# Patient Record
Sex: Female | Born: 1985 | Hispanic: No | Marital: Single | Smoking: Never smoker
Health system: Southern US, Community
[De-identification: ages and names within clinical notes are randomized; demographics above are authoritative.]

## PROBLEM LIST (undated history)

## (undated) ENCOUNTER — Emergency Department (HOSPITAL_COMMUNITY): Payer: PPO | Source: Home / Self Care

## (undated) DIAGNOSIS — O021 Missed abortion: Secondary | ICD-10-CM

## (undated) DIAGNOSIS — G43909 Migraine, unspecified, not intractable, without status migrainosus: Secondary | ICD-10-CM

## (undated) DIAGNOSIS — Z789 Other specified health status: Secondary | ICD-10-CM

## (undated) HISTORY — PX: NO PAST SURGERIES: SHX2092

## (undated) HISTORY — PX: MOUTH SURGERY: SHX715

## (undated) HISTORY — DX: Other specified health status: Z78.9

---

## 2015-11-12 ENCOUNTER — Ambulatory Visit (INDEPENDENT_AMBULATORY_CARE_PROVIDER_SITE_OTHER): Payer: PPO | Admitting: Emergency Medicine

## 2015-11-12 VITALS — BP 108/56 | HR 84 | Temp 98.1°F | Resp 16 | Ht 61.0 in | Wt 140.0 lb

## 2015-11-12 DIAGNOSIS — R42 Dizziness and giddiness: Secondary | ICD-10-CM | POA: Diagnosis not present

## 2015-11-12 DIAGNOSIS — G44209 Tension-type headache, unspecified, not intractable: Secondary | ICD-10-CM | POA: Diagnosis not present

## 2015-11-12 LAB — CBC
HEMATOCRIT: 37.9 % (ref 36.0–46.0)
Hemoglobin: 13.2 g/dL (ref 12.0–15.0)
MCH: 30.8 pg (ref 26.0–34.0)
MCHC: 34.8 g/dL (ref 30.0–36.0)
MCV: 88.6 fL (ref 78.0–100.0)
MPV: 9.6 fL (ref 8.6–12.4)
PLATELETS: 232 10*3/uL (ref 150–400)
RBC: 4.28 MIL/uL (ref 3.87–5.11)
RDW: 13.6 % (ref 11.5–15.5)
WBC: 5.9 10*3/uL (ref 4.0–10.5)

## 2015-11-12 LAB — COMPREHENSIVE METABOLIC PANEL
ALK PHOS: 42 U/L (ref 33–115)
ALT: 9 U/L (ref 6–29)
AST: 13 U/L (ref 10–30)
Albumin: 4.2 g/dL (ref 3.6–5.1)
BILIRUBIN TOTAL: 0.4 mg/dL (ref 0.2–1.2)
BUN: 9 mg/dL (ref 7–25)
CO2: 27 mmol/L (ref 20–31)
CREATININE: 0.56 mg/dL (ref 0.50–1.10)
Calcium: 9.3 mg/dL (ref 8.6–10.2)
Chloride: 105 mmol/L (ref 98–110)
GLUCOSE: 94 mg/dL (ref 65–99)
Potassium: 4.4 mmol/L (ref 3.5–5.3)
SODIUM: 139 mmol/L (ref 135–146)
Total Protein: 7 g/dL (ref 6.1–8.1)

## 2015-11-12 LAB — TSH: TSH: 0.64 u[IU]/mL (ref 0.350–4.500)

## 2015-11-12 LAB — LIPID PANEL
Cholesterol: 141 mg/dL (ref 125–200)
HDL: 67 mg/dL (ref >=46)
LDL CALC: 65 mg/dL (ref <130)
Total CHOL/HDL Ratio: 2.1 Ratio (ref <=5.0)
Triglycerides: 46 mg/dL (ref <150)
VLDL: 9 mg/dL (ref <30)

## 2015-11-12 MED ORDER — BUTALBITAL-APAP-CAFFEINE 50-325-40 MG PO TABS: 1.0000 | ORAL_TABLET | Freq: Four times a day (QID) | ORAL | Status: DC | PRN

## 2015-11-12 NOTE — Progress Notes (Signed)
Subjective:  Patient ID: Selena Diaz, female    DOB: 22-May-1986  Age: 29 y.o. MRN: [id number]  CC: Dizziness and low blood pressure   HPI Selena Diaz presents   The patient allows that she has frequent dizziness and headache when she ventures out of her house as in going to the store. 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 She denies any antecedent illness or injury. She has no cough nasal congestion postnasal drainage fever chills sore throat nausea vomiting or stool change. She has persistent atelectatic last days at a time and her dizziness is occasional. She's not pregnant she's not sexually active with her mother and a number of other extended family members  History Selena Diaz has no past medical history on file.   She has no past surgical history on file.   Her  family history includes Heart disease in her mother.  She   reports that she has never smoked. She has never used smokeless tobacco. She reports that she does not drink alcohol or use illicit drugs.  No outpatient prescriptions prior to visit.   No facility-administered medications prior to visit.    Social History   Social History  . Marital Status: Single    Spouse Name: N/A  . Number of Children: N/A  . Years of Education: N/A   Social History Main Topics  . Smoking status: Never Smoker   . Smokeless tobacco: Never Used  . Alcohol Use: No  . Drug Use: No  . Sexual Activity: Not Asked   Other Topics Concern  . None   Social History Narrative  . None     Review of Systems  Constitutional: Negative for fever, chills and appetite change.  HENT: Negative for congestion, ear pain, postnasal drip, sinus pressure and sore throat.   Eyes: Negative for pain and redness.  Respiratory: Negative for cough, shortness of breath and wheezing.   Cardiovascular: Negative for leg swelling.  Gastrointestinal: Negative for nausea, vomiting, abdominal pain,  diarrhea, constipation and blood in stool.  Endocrine: Negative for polyuria.  Genitourinary: Negative for dysuria, urgency, frequency and flank pain.  Musculoskeletal: Negative for gait problem.  Skin: Negative for rash.  Neurological: Positive for dizziness and headaches. Negative for weakness.  Psychiatric/Behavioral: Negative for confusion and decreased concentration. The patient is not nervous/anxious.     Objective:  BP 108/56 mmHg  Pulse 84  Temp(Src) 98.1 F (36.7 C) (Oral)  Resp 16  Ht 5\' 1"  (1.549 m)  Wt 140 lb (63.504 kg)  BMI 26.47 kg/m2  SpO2 98%  LMP 10/28/2015 (Approximate)  Physical Exam  Constitutional: She is oriented to person, place, and time. She appears well-developed and well-nourished. No distress.  HENT:  Head: Normocephalic and atraumatic.  Right Ear: External ear normal.  Left Ear: External ear normal.  Nose: Nose normal.  Eyes: Conjunctivae and EOM are normal. Pupils are equal, round, and reactive to light. No scleral icterus.  Neck: Normal range of motion. Neck supple. No tracheal deviation present.  Cardiovascular: Normal rate, regular rhythm and normal heart sounds.   Pulmonary/Chest: Effort normal. No respiratory distress. She has no wheezes. She has no rales.  Abdominal: She exhibits no mass. There is no tenderness. There is no rebound and no guarding.  Musculoskeletal: She exhibits no edema.  Lymphadenopathy:    She has no cervical adenopathy.  Neurological: She is alert and oriented to person, place, and time. Coordination normal.  Skin: Skin is warm  and dry. No rash noted.  Psychiatric: She has a normal mood and affect. Her behavior is normal.      Assessment & Plan:   Braxton was seen today for dizziness and low blood pressure.  Diagnoses and all orders for this visit:  Dizzy -     CBC -     Comprehensive metabolic panel -     Lipid panel -     TSH -     VITAMIN D 25 Hydroxy (Vit-D Deficiency, Fractures)  Tension headache -      CBC -     Comprehensive metabolic panel -     Lipid panel -     TSH  Other orders -     butalbital-acetaminophen-caffeine (FIORICET) 50-325-40 MG tablet; Take 1-2 tablets by mouth every 6 (six) hours as needed for headache.   I am having Selena Diaz start on butalbital-acetaminophen-caffeine.  Meds ordered this encounter  Medications  . butalbital-acetaminophen-caffeine (FIORICET) 50-325-40 MG tablet    Sig: Take 1-2 tablets by mouth every 6 (six) hours as needed for headache.    Dispense:  50 tablet    Refill:  1    Appropriate red flag conditions were discussed with the patient as well as actions that should be taken.  Patient expressed his understanding.  Follow-up: Return if symptoms worsen or fail to improve.  Carmelina Dane, MD

## 2015-11-12 NOTE — Patient Instructions (Signed)

## 2015-11-13 ENCOUNTER — Other Ambulatory Visit: Payer: Self-pay | Admitting: Emergency Medicine

## 2015-11-13 LAB — VITAMIN D 25 HYDROXY (VIT D DEFICIENCY, FRACTURES): VIT D 25 HYDROXY: 15 ng/mL — AB (ref 30–100)

## 2015-11-13 MED ORDER — VITAMIN D (ERGOCALCIFEROL) 1.25 MG (50000 UNIT) PO CAPS: 50000.0000 [IU] | ORAL_CAPSULE | ORAL | Status: DC

## 2015-12-08 ENCOUNTER — Ambulatory Visit (INDEPENDENT_AMBULATORY_CARE_PROVIDER_SITE_OTHER): Payer: PPO | Admitting: Family Medicine

## 2015-12-08 VITALS — BP 101/65 | HR 74 | Temp 98.1°F | Resp 16 | Ht 61.5 in | Wt 139.0 lb

## 2015-12-08 DIAGNOSIS — E559 Vitamin D deficiency, unspecified: Secondary | ICD-10-CM | POA: Diagnosis not present

## 2015-12-08 DIAGNOSIS — Z23 Encounter for immunization: Secondary | ICD-10-CM

## 2015-12-08 DIAGNOSIS — L603 Nail dystrophy: Secondary | ICD-10-CM

## 2015-12-08 MED ORDER — VITAMIN D (ERGOCALCIFEROL) 1.25 MG (50000 UNIT) PO CAPS: ORAL_CAPSULE | ORAL | Status: DC

## 2015-12-08 NOTE — Progress Notes (Signed)
Patient ID: Selena Diaz, female    DOB: 11/10/1986  Age: 30 y.o. MRN: [id number]  Chief Complaint  Patient presents with  . Hand Pain    both  . Follow-up    vitamin D   . Flu Vaccine    Subjective:   No major acute complaints. She wanted to know whether she could get more vitamin D at a time. She apparently is only been getting for a month even though it looks like a larger quantity was prescribed. She is taking one weekly 50,000 units. She feels well. Her concerns also are that her nails are turning brittle and she has been turning gray hair. 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 [place] is from [place] and her mother is here visiting.   Current allergies, medications, problem list, past/family and social histories reviewed.  Objective:  BP 101/65 mmHg  Pulse 74  Temp(Src) 98.1 F (36.7 C) (Oral)  Resp 16  Ht 5' 1.5" (1.562 m)  Wt 139 lb (63.05 kg)  BMI 25.84 kg/m2  SpO2 98%  LMP 11/25/2015  No major acute distress. Nails look fairly good, slightly yellowish.  Assessment & Plan:   Assessment: 1. Vitamin D deficiency   2. Brittle nails   3. Needs flu shot       Plan:   Orders Placed This Encounter  Procedures  . Flu Vaccine QUAD 36+ mos IM  . Ambulatory referral to Dermatology    Referral Priority:  Routine    Referral Type:  Consultation    Referral Reason:  Specialty Services Required    Requested Specialty:  Dermatology    Number of Visits Requested:  1    Meds ordered this encounter  Medications  . Vitamin D, Ergocalciferol, (DRISDOL) 50000 units CAPS capsule    Sig: Take one weekly for vitamin D    Dispense:  13 capsule    Refill:  1         Patient Instructions  Continue taking the vitamin D  You will get your flu shot today  Because of your brittle nails we are referring to a dermatologist  Take a multivitamin 1 daily also for the nails.     No Follow-up on file.   HOPPER,DAVID, MD  12/08/2015

## 2015-12-08 NOTE — Patient Instructions (Addendum)
Continue taking the vitamin D  You will get your flu shot today  Because of your brittle nails we are referring to a dermatologist  Take a multivitamin 1 daily also for the nails.

## 2016-09-15 ENCOUNTER — Ambulatory Visit (INDEPENDENT_AMBULATORY_CARE_PROVIDER_SITE_OTHER): Payer: PPO | Admitting: Physician Assistant

## 2016-09-15 VITALS — BP 100/62 | HR 75 | Temp 98.3°F | Resp 17 | Ht 61.5 in | Wt 137.0 lb

## 2016-09-15 DIAGNOSIS — E559 Vitamin D deficiency, unspecified: Secondary | ICD-10-CM

## 2016-09-15 DIAGNOSIS — G8929 Other chronic pain: Secondary | ICD-10-CM | POA: Diagnosis not present

## 2016-09-15 DIAGNOSIS — M898X1 Other specified disorders of bone, shoulder: Secondary | ICD-10-CM

## 2016-09-15 DIAGNOSIS — Z23 Encounter for immunization: Secondary | ICD-10-CM

## 2016-09-15 LAB — CBC
HCT: 38.2 % (ref 35.0–45.0)
Hemoglobin: 13.2 g/dL (ref 11.7–15.5)
MCH: 29.9 pg (ref 27.0–33.0)
MCHC: 34.6 g/dL (ref 32.0–36.0)
MCV: 86.4 fL (ref 80.0–100.0)
MPV: 9.6 fL (ref 7.5–12.5)
PLATELETS: 251 10*3/uL (ref 140–400)
RBC: 4.42 MIL/uL (ref 3.80–5.10)
RDW: 13.7 % (ref 11.0–15.0)
WBC: 5.5 10*3/uL (ref 3.8–10.8)

## 2016-09-15 MED ORDER — MELOXICAM 15 MG PO TABS: 15.0000 mg | ORAL_TABLET | Freq: Every day | ORAL | 1 refills | Status: DC

## 2016-09-15 NOTE — Patient Instructions (Addendum)
Please ice the area three times per day for 15 minutes.  Please perform three of the stretches.  Ice directly after at that time.  I am referring you to PT.  This musculature appears very weakened.  Please do not take ibuprofen or naproxen.    Scapular Winging With Rehab Scapular winging syndrome is also known as serratus anterior palsy or long thoracic nerve injury. The condition is an uncommon injury to the nervous system. The condition is caused by injury to the long thoracic nerve that runs through the neck and shoulder. Injury to the shoulder, such as a fall or repetitive stress on the shoulder causes the nerve to become stretched. Occasionally the injury is the result of an infection of the nerve. Damage to the long thoracic nerve results in weakness of the serratus anterior muscle. The serratus anterior muscle is responsible for controlling the shoulder blade (scapula). Weakness in this muscle results in a instability (winging) of the scapula. SYMPTOMS   Pain and weakness in the shoulder (usually the back of the shoulder) that is often diffuse or unable to localize.  Loss of or decrease in shoulder function.  Upper back pain while sitting, due to the scapula pressing on the back of the chair.  Visible deformity in the back of the shoulder. CAUSES  Scapular winging is caused by stretching of the long thoracic nerve. Common mechanisms of injury include:  Viral illness.  Repetitive and/or stressful use of the shoulder.  Falling onto the shoulder with the head and neck stretched away from the shoulder. RISK INCREASES WITH:  Contact sports (football, rugby, lacrosse, or soccer).  Activities involving overhead arm movement (baseball, volleyball, or racquet sports).  Poor strength and flexibility. PREVENTION  Warm up and stretch properly before activity.  Allow for adequate recovery between workouts.  Maintain physical fitness:  Strength, flexibility, and  endurance.  Cardiovascular fitness.  Learn and use proper technique. When possible, have a coach correct improper technique. PROGNOSIS  Scapular winging normally resolves spontaneously within 18 months. In rare circumstances surgery is recommended.  RELATED COMPLICATIONS   Permanent nerve damage, including pain, numbness, tingle, or weakness.  Shoulder weakness.  Recurrent shoulder pain.  Inability to compete in athletics. TREATMENT Treatment initially involves resting from any activities that aggravate your symptoms. The use of ice and medication may help reduce pain and inflammation. The use of strengthening and stretching exercises may help reduce pain with activity, specifically shoulder exercises that improve range of motion. These exercises may be performed at home or with referral to a therapist. If symptoms persist for greater than 6 months despite non-surgical (conservative) treatment, then surgery may be recommended. Surgery is only used for the most serious cases and the purpose is to regain function, not to allow an athlete to return to sports. MEDICATION   If pain medication is necessary, then nonsteroidal anti-inflammatory medications, such as aspirin and ibuprofen, or other minor pain relievers, such as acetaminophen, are often recommended.  Do not take pain medication for 7 days before surgery.  Prescription pain relievers may be given if deemed necessary by your caregiver. Use only as directed and only as much as you need. HEAT AND COLD  Cold treatment (icing) relieves pain and reduces inflammation. Cold treatment should be applied for 10 to 15 minutes every 2 to 3 hours for inflammation and pain and immediately after any activity that aggravates your symptoms. Use ice packs or massage the area with a piece of ice (ice massage).  Heat treatment  may be used prior to performing the stretching and strengthening activities prescribed by your caregiver, physical therapist, or  athletic trainer. Use a heat pack or soak the injury in warm water. SEEK MEDICAL CARE IF:  Treatment seems to offer no benefit, or the condition worsens.  Any medications produce adverse side effects. EXERCISES  RANGE OF MOTION (ROM) AND STRETCHING EXERCISES - Scapular Winging (Serratus Anterior Palsy, Long Thoracic Nerve Injury)  These exercises may help you when beginning to rehabilitate your injury. Your symptoms may resolve with or without further involvement from your physician, physical therapist or athletic trainer. While completing these exercises, remember:   Restoring tissue flexibility helps normal motion to return to the joints. This allows healthier, less painful movement and activity.  An effective stretch should be held for at least 30 seconds.  A stretch should never be painful. You should only feel a gentle lengthening or release in the stretched tissue. ROM - Pendulum  Bend at the waist so that your right / left arm falls away from your body. Support yourself with your opposite hand on a solid surface, such as a table or a countertop.  Your right / left arm should be perpendicular to the ground. If it is not perpendicular, you need to lean over farther. Relax the muscles in your right / left arm and shoulder as much as possible.  Gently sway your hips and trunk so they move your right / left arm without any use of your right / left shoulder muscles.  Progress your movements so that your right / left arm moves side to side, then forward and backward, and finally, both clockwise and counterclockwise.  Complete __________ repetitions in each direction. Many people use this exercise to relieve discomfort in their shoulder as well as to gain range of motion. Repeat __________ times. Complete this exercise __________ times per day. STRETCH - Flexion, Seated   Sit in a firm chair so that your right / left forearm can rest on a table or on a table or countertop. Your right /  left elbow should rest below the height of your shoulder so that your shoulder feels supported and not tense or uncomfortable.  Keeping your right / left shoulder relaxed, lean forward at your waist, allowing your right / left hand to slide forward. Bend forward until you feel a moderate stretch in your shoulder, but before you feel an increase in your pain.  Hold __________ seconds. Slowly return to your starting position. Repeat __________ times. Complete this exercise __________ times per day.  STRETCH - Flexion, Standing  Stand with good posture. With an underhand grip on your right / left and an overhand grip on the opposite hand, grasp a broomstick or cane so that your hands are a little more than shoulder-width apart.  Keeping your right / left elbow straight and shoulder muscles relaxed, push the stick with your opposite hand to raise your right / left arm in front of your body and then overhead. Raise your arm until you feel a stretch in your right / left shoulder, but before you have increased shoulder pain.  Avoid shrugging your right / left shoulder as your arm rises by keeping your shoulder blade tucked down and toward your mid-back spine. Hold __________ seconds.  Slowly return to the starting position. Repeat __________ times. Complete this exercise __________ times per day. STRETCH - Abduction, Supine  Stand with good posture. With an underhand grip on your right / left and an overhand  grip on the opposite hand, grasp a broomstick or cane so that your hands are a little more than shoulder-width apart.  Keeping your right / left elbow straight and shoulder muscles relaxed, push the stick with your opposite hand to raise your right / left arm out to the side of your body and then overhead. Raise your arm until you feel a stretch in your right / left shoulder, but before you have increased shoulder pain.  Avoid shrugging your right / left shoulder as your arm rises by keeping your  shoulder blade tucked down and toward your mid-back spine. Hold __________ seconds.  Slowly return to the starting position. Repeat __________ times. Complete this exercise __________ times per day. ROM - Flexion, Active-Assisted  Lie on your back. You may bend your knees for comfort.  Grasp a broomstick or cane so your hands are about shoulder-width apart. Your right / left hand should grip the end of the stick/cane so that your hand is positioned "thumbs-up," as if you were about to shake hands.  Using your healthy arm to lead, raise your right / left arm overhead until you feel a gentle stretch in your shoulder. Hold __________ seconds.  Use the stick/cane to assist in returning your right / left arm to its starting position. Repeat __________ times. Complete this exercise __________ times per day.  STRENGTHENING EXERCISES - Scapular Winging (Serratus Anterior Palsy, Long Thoracic Nerve Injury) These exercises may help you when beginning to rehabilitate your injury. They may resolve your symptoms with or without further involvement from your physician, physical therapist or athletic trainer. While completing these exercises, remember:   Muscles can gain both the endurance and the strength needed for everyday activities through controlled exercises.  Complete these exercises as instructed by your physician, physical therapist or athletic trainer. Progress with the resistance and repetition exercises only as your caregiver advises.  You may experience muscle soreness or fatigue, but the pain or discomfort you are trying to eliminate should never worsen during these exercises. If this pain does worsen, stop and make certain you are following the directions exactly. If the pain is still present after adjustments, discontinue the exercise until you can discuss the trouble with your clinician.  During your recovery, avoid activity or exercises which involve actions that place your injured hand or  elbow above your head or behind your back or head. These positions stress the tissues which are trying to heal. STRENGTH - Scapular Depression and Adduction   With good posture, sit on a firm chair. Supported your arms in front of you with pillows, arm rests or a table top. Have your elbows in line with the sides of your body.  Gently draw your shoulder blades down and toward your mid-back spine. Gradually increase the tension without tensing the muscles along the top of your shoulders and the back of your neck.  Hold for __________ seconds. Slowly release the tension and relax your muscles completely before completing the next repetition.  After you have practiced this exercise, remove the arm support and complete it in standing as well as sitting. Repeat __________ times. Complete this exercise __________ times per day.  STRENGTH - Scapular Protractors, Standing   Stand arms-length away from a wall. Place your hands on the wall, keeping your elbows straight.  Begin by dropping your shoulder blades down and toward your mid-back spine.  To strengthen your protractors, keep your shoulder blades down, but slide them forward on your rib cage. It will  feel as if you are lifting the back of your rib cage away from the wall. This is a subtle motion and can be challenging to complete. Ask your clinician for further instruction if you are not sure you are doing the exercise correctly.  Hold for __________ seconds. Slowly return to the starting position, resting the muscles completely before completing the next repetition. Repeat __________ times. Complete this exercise __________ times per day. STRENGTH - Scapular Protractors, Supine  Lie on your back on a firm surface. Extend your right / left arm straight into the air while holding a __________ weight in your hand.  Keeping your head and back in place, lift your shoulder off the floor.  Hold __________ seconds. Slowly return to the starting  position and allow your muscles to relax completely before completing the next repetition. Repeat __________ times. Complete this exercise __________ times per day. STRENGTH - Scapular Protractors, Quadruped  Get onto your hands and knees with your shoulders directly over your hands (or as close as you comfortably can be).  Keeping your elbows locked, lift the back of your rib cage up into your shoulder blades so your mid-back rounds-out. Keep your neck muscles relaxed.  Hold this position for __________ seconds. Slowly return to the starting position and allow your muscles to relax completely before completing the next repetition. Repeat __________ times. Complete this exercise __________ times per day.  STRENGTH - Scapular Depressors  Keeping your feet on the floor, lift your bottom from the seat and lock your elbows.  Keeping your elbows straight, allow gravity to pull your body weight down. Your shoulders will rise toward your ears.  Raise your body against gravity by drawing your shoulder blades down your back, shortening the distance between your shoulders and ears. Although your feet should always maintain contact with the floor, your feet should progressively support less body weight as you get stronger.  Hold __________ seconds. In a controlled and slow manner, lower your body weight to begin the next repetition. Repeat __________ times. Complete this exercise __________ times per day.  STRENGTH - Shoulder Extensors, Prone  Lie on your stomach on a firm surface so that your right / left arm overhangs the edge. Rest your forehead on your opposite forearm. With your thumb facing away from your body and your elbow straight, hold a __________ weight in your hand.  Squeeze your right / left shoulder blade to your mid-back spine and then slowly raise your arm behind you to the height of the bed.  Hold for __________ seconds. Slowly reverse the directions and return to the starting  position, controlling the weight as you lower your arm. Repeat __________ times. Complete this exercise __________ times per day.  STRENGTH - Horizontal Abductors Choose one of the two oppositions to complete this exercise. Prone: lying on stomach:  Lie on your stomach on a firm surface so that your right / left arm overhangs the edge. Rest your forehead on your opposite forearm. With your palm facing the floor and your elbow straight, hold a __________ weight in your hand.  Squeeze your right / left shoulder blade to your mid-back spine and then slowly raise your arm to the height of the bed.  Hold for __________ seconds. Slowly reverse the directions and return to the starting position, controlling the weight as you lower your arm. Repeat __________ times. Complete this exercise __________ times per day. Standing:  Secure a rubber exercise band/tubing so that it is at the height  of your shoulders when you are either standing or sitting on a firm arm-less chair.  Grasp an end of the band/tubing in each hand and have your palms face each other. Straighten your elbows and lift your hands straight in front of you at shoulder height. Step back away from the secured end of band/tubing until it becomes tense.  Squeeze your shoulder blades together. Keeping your elbows locked and your hands at shoulder-height, bring your hands out to your side.  Hold __________ seconds. Slowly ease the tension on the band/tubing as you reverse the directions and return to the starting position. Repeat __________ times. Complete this exercise __________ times per day. STRENGTH - Scapular Retractors  Secure a rubber exercise band/tubing so that it is at the height of your shoulders when you are either standing or sitting on a firm arm-less chair.  With a palm-down grip, grasp an end of the band/tubing in each hand. Straighten your elbows and lift your hands straight in front of you at shoulder height. Step back  away from the secured end of band/tubing until it becomes tense.  Squeezing your shoulder blades together, draw your elbows back as you bend them. Keep your upper arm lifted away from your body throughout the exercise.  Hold __________ seconds. Slowly ease the tension on the band/tubing as you reverse the directions and return to the starting position. Repeat __________ times. Complete this exercise __________ times per day. STRENGTH - Shoulder Extensors   Secure a rubber exercise band/tubing so that it is at the height of your shoulders when you are either standing or sitting on a firm arm-less chair.  With a thumbs-up grip, grasp an end of the band/tubing in each hand. Straighten your elbows and lift your hands straight in front of you at shoulder height. Step back away from the secured end of band/tubing until it becomes tense.  Squeezing your shoulder blades together, pull your hands down to the sides of your thighs. Do not allow your hands to go behind you.  Hold for __________ seconds. Slowly ease the tension on the band/tubing as you reverse the directions and return to the starting position. Repeat __________ times. Complete this exercise __________ times per day.  STRENGTH - Scapular Retractors and External Rotators  Secure a rubber exercise band/tubing so that it is at the height of your shoulders when you are either standing or sitting on a firm arm-less chair.  With a palm-down grip, grasp an end of the band/tubing in each hand. Bend your elbows 90 degrees and lift your elbows to shoulder height at your sides. Step back away from the secured end of band/tubing until it becomes tense.  Squeezing your shoulder blades together, rotate your shoulder so that your upper arm and elbow remain stationary, but your fists travel upward to head-height.  Hold __________ for seconds. Slowly ease the tension on the band/tubing as you reverse the directions and return to the starting  position. Repeat __________ times. Complete this exercise __________ times per day.  STRENGTH - Scapular Retractors and External Rotators, Rowing  Secure a rubber exercise band/tubing so that it is at the height of your shoulders when you are either standing or sitting on a firm arm-less chair.  With a palm-down grip, grasp an end of the band/tubing in each hand. Straighten your elbows and lift your hands straight in front of you at shoulder height. Step back away from the secured end of band/tubing until it becomes tense.  Step 1: Squeeze  your shoulder blades together. Bending your elbows, draw your hands to your chest as if you are rowing a boat. At the end of this motion, your hands and elbow should be at shoulder-height and your elbows should be out to your sides.  Step 2: Rotate your shoulder to raise your hands above your head. Your forearms should be vertical and your upper-arms should be horizontal.  Hold for __________ seconds. Slowly ease the tension on the band/tubing as you reverse the directions and return to the starting position. Repeat __________ times. Complete this exercise __________ times per day.  STRENGTH - Scapular Retractors and Elevators  Secure a rubber exercise band/tubing so that it is at the height of your shoulders when you are either standing or sitting on a firm arm-less chair.  With a thumbs-up grip, grasp an end of the band/tubing in each hand. Step back away from the secured end of band/tubing until it becomes tense.  Squeezing your shoulder blades together, straighten your elbows and lift your hands straight over your head.  Hold for __________ seconds. Slowly ease the tension on the band/tubing as you reverse the directions and return to the starting position. Repeat __________ times. Complete this exercise __________ times per day.    This information is not intended to replace advice given to you by your health care provider. Make sure you discuss any  questions you have with your health care provider.   Document Released: 11/21/2005 Document Revised: 04/07/2015 Document Reviewed: 03/05/2009 Elsevier Interactive Patient Education 2016 [employer].    IF you received an x-ray today, you will receive an invoice from [facility]. Please contact [facility] at [phone] with questions or concerns regarding your invoice.   IF you received labwork today, you will receive an invoice from [employer]. Please contact [facility] at [phone] with questions or concerns regarding your invoice.   Our billing staff will not be able to assist you with questions regarding bills from these companies.  You will be contacted with the lab results as soon as they are available. The fastest way to get your results is to activate your My Chart account. Instructions are located on the last page of this paperwork. If you have not heard from [place] regarding the results in 2 weeks, please contact this office.

## 2016-09-15 NOTE — Progress Notes (Signed)
[facility] [address], [place] [address] [phone]  Date:  09/15/2016   Name:  Selena Diaz   DOB:  19-Apr-1986   MRN:  [id number]  PCP:  No PCP Per Patient    History of Present Illness:  Selena Diaz is a 30 y.o. female patient who presents to [facility] for cc of shoulder pain and recheck of vitamin D. Patient reports that she has had shoulder pain for about 4 years now. It hurts at her right shoulder blade. She says that it feels like it's open. It feels better with leaning back. She reports no injury. It also is aggravated by carrying objects and with driving long distances with her hands. Patient reports no numbness or tingling. There is no swelling.  She would also like to recheck her vitamin D. She is currently taking vitamin D medication at this time. She would also like to make sure that she has not iron deficient. She denies any heavy menses. Menstrual cycle occurs for about 5 days with one day that is heavy. She may go through 2-3 sanitary napkins. Patient denies any fatigue, palpitations, or chest pains.   There are no active problems to display for this patient.   No past medical history on file.  No past surgical history on file.  Social History  Substance Use Topics  . Smoking status: Never Smoker  . Smokeless tobacco: Never Used  . Alcohol use No    Family History  Problem Relation Age of Onset  . Heart disease Mother     No Known Allergies  Medication list has been reviewed and updated.  No current outpatient prescriptions on file prior to visit.   No current facility-administered medications on file prior to visit.     ROS ROS otherwise unremarkable unless listed above.  Physical Examination: BP 100/62 (BP Location: Right Arm, Patient Position: Sitting, Cuff Size: Normal)   Pulse 75   Temp 98.3 F (36.8 C) (Oral)   Resp 17   Ht 5' 1.5" (1.562 m)   Wt 137 lb (62.1 kg)   LMP 09/15/2016   SpO2 99%   BMI 25.47 kg/m   Ideal Body Weight: Weight in (lb) to have BMI = 25: 134.2  Physical Exam  Constitutional: She is oriented to person, place, and time. She appears well-developed and well-nourished. No distress.  HENT:  Head: Normocephalic and atraumatic.  Right Ear: External ear normal.  Left Ear: External ear normal.  Eyes: Conjunctivae and EOM are normal. Pupils are equal, round, and reactive to light.  Cardiovascular: Normal rate.  Exam reveals no friction rub.   No murmur heard. Pulmonary/Chest: Effort normal. No apnea. No respiratory distress. She has no decreased breath sounds. She has no wheezes. She has no rhonchi.  Musculoskeletal:  There is tenderness along the scapula inferiorly. It appears that there is some scapular winging with maneuvering. She appears with decreased strength along the right side with posterior thoracic strength.  Neurological: She is alert and oriented to person, place, and time.  Skin: She is not diaphoretic.  Psychiatric: She has a normal mood and affect. Her behavior is normal.     Assessment and Plan: [name] is a 30 y.o. female who is here today for cc of back pain, and vitamin D recheck. There is possible scapular winging. Possible nerve entrapment. Advising that she go to physical therapy as this was very weakened at this time. Advised anti-inflammatory and ice at this time. Physical therapy  referral ordered today and appreciated. Will recheck vitamin D. Also added a CBC. If her hemoglobin and MCV appear normal, will not pursue ferritin and iron levels at this time. Vitamin D deficiency - Plan: CBC, VITAMIN D 25 Hydroxy (Vit-D Deficiency, Fractures)  Need for prophylactic vaccination and inoculation against influenza - Plan: Flu Vaccine QUAD 36+ mos IM  Chronic scapular pain - Plan: meloxicam (MOBIC) 15 MG tablet, Ambulatory referral to Physical Therapy  Trena PlattStephanie Mykala Mccready, PA-C [facility] 09/15/2016 3:52  PM

## 2016-09-16 LAB — VITAMIN D 25 HYDROXY (VIT D DEFICIENCY, FRACTURES): VIT D 25 HYDROXY: 22 ng/mL — AB (ref 30–100)

## 2016-09-21 ENCOUNTER — Ambulatory Visit: Payer: PRIVATE HEALTH INSURANCE | Attending: Physician Assistant | Admitting: Physical Therapy

## 2016-09-21 DIAGNOSIS — M542 Cervicalgia: Secondary | ICD-10-CM | POA: Diagnosis present

## 2016-09-21 DIAGNOSIS — R293 Abnormal posture: Secondary | ICD-10-CM | POA: Diagnosis present

## 2016-09-21 NOTE — Therapy (Signed)
[facility] [address], [address] [place], [place], [address] Phone: [phone]   Fax:  [phone]  Physical Therapy Evaluation  Patient Details  Name: Selena Diaz MRN: [id number] Date of Birth: [birth year] Referring Provider: Trena PlattStephanie English, PA  Encounter Date: 09/21/2016      PT End of Session - 09/21/16 1330    Visit Number 1   Number of Visits 12   Date for PT Re-Evaluation 11/21/18   Authorization Type 12 visits, [employer]   PT Start Time 1235   PT Stop Time 1320   PT Time Calculation (min) 45 min   Activity Tolerance Patient tolerated treatment well   Behavior [facility] Therapy [facility] for tasks assessed/performed      No past medical history on file.  No past surgical history on file.  There were no vitals filed for this visit.       Subjective Assessment - 09/21/16 1243    Subjective Pt arriving to therapy complaining of 7/10 R shoulder pain which began about 4 years ago and has gradually  gotten worse. Pt is currently in graduate school for IT and reports sitting  a lot during the day.  Pt reports cupping makes it feel better for about 2 weeks but then it returns.    Limitations Sitting;House hold activities;Lifting   How long can you sit comfortably? 20 minutes   How long can you stand comfortably? unlimited   How long can you walk comfortably? unlimited without holding her purse.    Patient Stated Goals stop hurting in my arm and return to PLOF   Currently in Pain? Yes   Pain Score 7    Pain Location Shoulder   Pain Orientation Right   Pain Descriptors / Indicators Aching;Stabbing   Pain Type Chronic pain   Pain Onset More than a month ago   Pain Frequency Constant   Aggravating Factors  sitting, working on computer, sleeping   Pain Relieving Factors positioning, cupping   Effect of Pain on Daily Activities difficulty with working on computers with graduate work, sleeping, sitting,    Multiple Pain Sites  No            [facility] PT Assessment - 09/21/16 0001      Assessment   Medical Diagnosis R shoulder pain   Referring Provider Trena PlattStephanie English, PA   Hand Dominance Right   Prior Therapy none     Precautions   Precautions None     Balance Screen   Has the patient fallen in the past 6 months No     Home Environment   Living Environment Private residence   Living Arrangements Parent;Other relatives   Available Help at Discharge Family   Type of Home House   Home Access Level entry   Home Layout One level     Prior Function   Level of Independence Independent   Marketing executiveVocation Student   Vocation Requirements graduate student   Leisure walking     Cognition   Overall Cognitive Status Within Functional Limits for tasks assessed     Observation/Other Assessments   Focus on Therapeutic Outcomes (FOTO)  34% limitation     ROM / Strength   AROM / PROM / Strength AROM     AROM   AROM Assessment Site Shoulder;Cervical   Right/Left Shoulder Right;Left   Right Shoulder Extension 35 Degrees   Right Shoulder Flexion 154 Degrees   Right Shoulder ABduction 142 Degrees   Right Shoulder Internal Rotation 78  Degrees   Right Shoulder External Rotation 82 Degrees   Left Shoulder Extension 40 Degrees   Left Shoulder Flexion 165 Degrees   Left Shoulder ABduction 162 Degrees   Left Shoulder Internal Rotation 78 Degrees   Left Shoulder External Rotation 90 Degrees   Cervical Flexion 25   Cervical Extension 26   Cervical - Right Side Bend 28   Cervical - Left Side Bend 38   Cervical - Right Rotation 40   Cervical - Left Rotation 60     Special Tests    Special Tests Rotator Cuff Impingement   Rotator Cuff Impingment tests Empty Can test;Full Can test     Empty Can test   Findings Negative     Full Can test   Findings Negative                           PT Education - 09/21/16 1329    Education provided Yes   Education Details HEP   Person(s) Educated  Patient   Methods Explanation;Demonstration;Tactile cues;Verbal cues;Handout   Comprehension Returned demonstration;Verbalized understanding                  Patient will benefit from skilled therapeutic intervention in order to improve the following deficits and impairments:     Visit Diagnosis: Cervicalgia  Abnormal posture     Problem List There are no active problems to display for this patient.   Sharmon Leyden, MPT  09/21/2016, 1:35 PM  [facility] [address] [place], [place], [address] Phone: [phone]   Fax:  [phone]  Name: Selena Diaz MRN: [id number] Date of Birth: [birth year]

## 2016-09-21 NOTE — Patient Instructions (Addendum)
   Upper Trap Stretch: 5 times, holding 10 seconds, 2 times each day    Cervical rotaion: 5 times, holdinf 10 seconds, 2 times each day    Corner stretch: 5 times holding 20 seconds, 2 times each day

## 2016-09-28 ENCOUNTER — Ambulatory Visit: Payer: PRIVATE HEALTH INSURANCE | Admitting: Physical Therapy

## 2016-09-28 DIAGNOSIS — R293 Abnormal posture: Secondary | ICD-10-CM

## 2016-09-28 DIAGNOSIS — M542 Cervicalgia: Secondary | ICD-10-CM | POA: Diagnosis not present

## 2016-09-28 NOTE — Therapy (Addendum)
[facility] [address] [place], [place], [address] Phone: [phone]   Fax:  [phone]  Physical Therapy Treatment  Patient Details  Name: Selena Diaz MRN: [id number] Date of Birth: 09/25/1986 Referring Provider: Trena Platt, PA  Encounter Date: 09/28/2016      PT End of Session - 09/29/16 1301    Visit Number 3   Number of Visits 12   Date for PT Re-Evaluation 11/21/18   Authorization Type 12 visits, [employer]   PT Start Time 1235   PT Stop Time 1311   PT Time Calculation (min) 36 min   Activity Tolerance Patient tolerated treatment well   Behavior During Therapy [facility] for tasks assessed/performed      No past medical history on file.  No past surgical history on file.  There were no vitals filed for this visit.      Subjective Assessment - 09/29/16 1238    Subjective Pt reports shoulders feeling better today. Has been doing home exercises.    Limitations Sitting;House hold activities;Lifting   How long can you sit comfortably? 20 minutes   How long can you stand comfortably? unlimited   How long can you walk comfortably? unlimited without holding her purse.    Patient Stated Goals stop hurting in my arm and return to PLOF   Currently in Pain? Yes   Pain Score 4    Pain Location Shoulder   Pain Orientation Right   Pain Descriptors / Indicators Aching;Stabbing   Pain Type Chronic pain   Pain Onset More than a month ago                         [facility] Adult PT Treatment/Exercise - 09/29/16 0001      Neck Exercises: Machines for Strengthening   UBE (Upper Arm Bike) 2 minutes forward 2 minutes back  Therapist discussing treatment     Neck Exercises: Theraband   Shoulder Extension 20 reps;Red   Rows Red;20 reps     Shoulder Exercises: Supine   Horizontal ABduction Strengthening;Both;20 reps;Theraband   Theraband Level (Shoulder Horizontal ABduction) Level 2 (Red)   External  Rotation Strengthening;Both;20 reps;Theraband   Theraband Level (Shoulder External Rotation) Level 2 (Red)   Flexion Both;20 reps;Theraband  Narrow and widr grip   Theraband Level (Shoulder Flexion) Level 2 (Red)     Shoulder Exercises: Standing   Other Standing Exercises wall angels x 10 with verbal  indstructions  Therapist discussing treament     Shoulder Exercises: ROM/Strengthening   Other ROM/Strengthening Exercises Chest stretch at wall                PT Education - 09/28/16 1320    Education provided Yes   Education Details added 2 exercises to HEP   Person(s) Educated Patient   Methods Explanation;Demonstration;Tactile cues;Verbal cues;Handout   Comprehension Verbalized understanding;Returned demonstration          PT Short Term Goals - 09/28/16 1322      PT SHORT TERM GOAL #1   Title Pt will be independent in her HEP.   Time 3   Period Weeks   Status On-going     PT SHORT TERM GOAL #2   Title pt will be able to report decreased pain at rest <4/10 in order to improve quality of life and functional mobility.    Time 3   Period Weeks   Status On-going  PT Long Term Goals - 09/21/16 1352      PT LONG TERM GOAL #1   Title Pt will improve FOTO from 34% limitaion to 24% limitation.   Time 6   Period Weeks   Status New     PT LONG TERM GOAL #2   Title Pt will be bale to sit comfortably for 2 hours with only intermittent standing breaks in order to functional in her graduate studies.    Time 6   Period Weeks   Status New     PT LONG TERM GOAL #3   Title Pt will improve her cervical rotation to the right by 20 degrees in order to improve driving safety.    Baseline R rotation 40 degrees   Time 6   Period Weeks   Status New     PT LONG TERM GOAL #4   Title Pt will improve her R shoulder abduction by 20 degrees in order to improve functional mobility.    Baseline R abd 142 degrees, L abd 162 degrees   Time 6   Period Weeks   Status  New               Plan - 09/29/16 1339    Clinical Impression Statement Pt able to tolerate all strengthening exercises well needing verbal cues for technique and posture. Pt will continue to benfit from skilled therapy for postural training and strengthening.    Rehab Potential Excellent   PT Frequency 2x / week   PT Duration 6 weeks   PT Treatment/Interventions ADLs/Self Care Home Management;Patient/family education;Dry needling;Taping;Electrical Stimulation;Iontophoresis 4mg /ml Dexamethasone;Moist Heat;Manual techniques;Passive range of motion;Cryotherapy;Functional mobility training;Therapeutic activities;Therapeutic exercise   PT Next Visit Plan postural correction and edu for using her computer, core strengthening, cervical stretching, pectoralis stretching, lying on foam roller/towel,    Consulted and Agree with Plan of Care Patient      Patient will benefit from skilled therapeutic intervention in order to improve the following deficits and impairments:     Visit Diagnosis: Cervicalgia  Abnormal posture     Problem List There are no active problems to display for this patient.   Sharmon LeydenJennifer R Martin , MPT 09/29/2016, 2:13 PM  [facility] [address], [address] [place], [place], [address] Phone: [phone]   Fax:  [phone]  Name: Selena Diaz MRN: [id number] Date of Birth: 22-Nov-1986

## 2016-09-28 NOTE — Patient Instructions (Signed)
   Perform 10 times then rest anfd then repeat 10 more times, 1-2 times each day using red thera band.     Stand with your feet shoulder width apart, buttocks against the wall, shoulder blades against the wall and try to keep your elbows against the wall while lifting your arms above your head. Perform 10 times, 1-2 times each day.

## 2016-09-29 ENCOUNTER — Encounter: Payer: Self-pay | Admitting: Physical Therapy

## 2016-09-29 ENCOUNTER — Ambulatory Visit: Payer: PRIVATE HEALTH INSURANCE | Admitting: Physical Therapy

## 2016-09-29 DIAGNOSIS — R293 Abnormal posture: Secondary | ICD-10-CM

## 2016-09-29 DIAGNOSIS — M542 Cervicalgia: Secondary | ICD-10-CM

## 2016-09-29 NOTE — Therapy (Signed)
[facility] [address], [address] [place], [place], [address] Phone: [phone]   Fax:  [phone]  Physical Therapy Treatment  Patient Details  Name: Selena DeedsReem Diaz MRN: [id number] Date of Birth: 1986/05/27 Referring Provider: Trena PlattStephanie English, PA  Encounter Date: 09/29/2016      PT End of Session - 09/29/16 1301    Visit Number 3   Number of Visits 12   Date for PT Re-Evaluation 11/21/18   Authorization Type 12 visits, [employer]   PT Start Time 1235   PT Stop Time 1311   PT Time Calculation (min) 36 min   Activity Tolerance Patient tolerated treatment well   Behavior During Therapy [facility] for tasks assessed/performed      History reviewed. No pertinent past medical history.  History reviewed. No pertinent surgical history.  There were no vitals filed for this visit.      Subjective Assessment - 09/29/16 1238    Subjective Pt reports shoulders feeling better today. Has been doing home exercises.    Limitations Sitting;House hold activities;Lifting   How long can you sit comfortably? 20 minutes   How long can you stand comfortably? unlimited   How long can you walk comfortably? unlimited without holding her purse.    Patient Stated Goals stop hurting in my arm and return to PLOF   Currently in Pain? Yes   Pain Score 4    Pain Location Shoulder   Pain Orientation Right   Pain Descriptors / Indicators Aching;Stabbing   Pain Type Chronic pain   Pain Onset More than a month ago                         [facility] Treatment/Exercise - 09/29/16 0001      Neck Exercises: Machines for Strengthening   UBE (Upper Arm Bike) 2 minutes forward 2 minutes back  Therapist discussing treatment     Neck Exercises: Theraband   Shoulder Extension 20 reps;Red   Rows Red;20 reps     Shoulder Exercises: Supine   Horizontal ABduction Strengthening;Both;20 reps;Theraband   Theraband Level (Shoulder Horizontal  ABduction) Level 2 (Red)   External Rotation Strengthening;Both;20 reps;Theraband   Theraband Level (Shoulder External Rotation) Level 2 (Red)   Flexion Both;20 reps;Theraband  Narrow and widr grip   Theraband Level (Shoulder Flexion) Level 2 (Red)     Shoulder Exercises: Standing   Other Standing Exercises wall angels x 10 with verbal  indstructions  Therapist discussing treament     Shoulder Exercises: ROM/Strengthening   Other ROM/Strengthening Exercises Chest stretch at wall                PT Education - 09/28/16 1320    Education provided Yes   Education Details added 2 exercises to HEP   Person(s) Educated Patient   Methods Explanation;Demonstration;Tactile cues;Verbal cues;Handout   Comprehension Verbalized understanding;Returned demonstration          PT Short Term Goals - 09/28/16 1322      PT SHORT TERM GOAL #1   Title Pt will be independent in her HEP.   Time 3   Period Weeks   Status On-going     PT SHORT TERM GOAL #2   Title pt will be able to report decreased pain at rest <4/10 in order to improve quality of life and functional mobility.    Time 3   Period Weeks   Status On-going  PT Long Term Goals - 09/21/16 1352      PT LONG TERM GOAL #1   Title Pt will improve FOTO from 34% limitaion to 24% limitation.   Time 6   Period Weeks   Status New     PT LONG TERM GOAL #2   Title Pt will be bale to sit comfortably for 2 hours with only intermittent standing breaks in order to functional in her graduate studies.    Time 6   Period Weeks   Status New     PT LONG TERM GOAL #3   Title Pt will improve her cervical rotation to the right by 20 degrees in order to improve driving safety.    Baseline R rotation 40 degrees   Time 6   Period Weeks   Status New     PT LONG TERM GOAL #4   Title Pt will improve her R shoulder abduction by 20 degrees in order to improve functional mobility.    Baseline R abd 142 degrees, L abd 162  degrees   Time 6   Period Weeks   Status New               Plan - 09/29/16 1339    Clinical Impression Statement Pt able to tolerate all strengthening exercises well needing verbal cues for technique and posture. Pt will continue to benfit from skilled therapy for postural training and strengthening.    Rehab Potential Excellent   PT Frequency 2x / week   PT Duration 6 weeks   PT Treatment/Interventions ADLs/Self Care Home Management;Patient/family education;Dry needling;Taping;Electrical Stimulation;Iontophoresis 4mg /ml Dexamethasone;Moist Heat;Manual techniques;Passive range of motion;Cryotherapy;Functional mobility training;Therapeutic activities;Therapeutic exercise   PT Next Visit Plan postural correction and edu for using her computer, core strengthening, cervical stretching, pectoralis stretching, lying on foam roller/towel,    Consulted and Agree with Plan of Care Patient      Patient will benefit from skilled therapeutic intervention in order to improve the following deficits and impairments:  Pain, Impaired UE functional use, Decreased activity tolerance, Decreased range of motion, Postural dysfunction, Improper body mechanics  Visit Diagnosis: Cervicalgia  Abnormal posture     Problem List There are no active problems to display for this patient.   Dessa Phi PTA 09/29/2016, 1:41 PM  [facility] [address], [address] [place], [place], [address] Phone: [phone]   Fax:  [phone]  Name: Selena Diaz MRN: [id number] Date of Birth: [birth year]

## 2016-10-04 ENCOUNTER — Ambulatory Visit: Payer: PRIVATE HEALTH INSURANCE | Admitting: Physical Therapy

## 2016-10-04 DIAGNOSIS — R293 Abnormal posture: Secondary | ICD-10-CM

## 2016-10-04 DIAGNOSIS — M542 Cervicalgia: Secondary | ICD-10-CM

## 2016-10-04 NOTE — Therapy (Signed)
[facility] [address], [address] [place], [place], [address] Phone: [phone]   Fax:  [phone]  Physical Therapy Treatment  Patient Details  Name: Selena Diaz MRN: [id number] Date of Birth: 07/23/1986 Referring Provider: Trena PlattStephanie English, PA  Encounter Date: 10/04/2016      PT End of Session - 10/04/16 1313    Visit Number 4   Number of Visits 12   Date for PT Re-Evaluation 11/21/18   Authorization Type 12 visits, [employer]   PT Start Time 1234   PT Stop Time 1322   PT Time Calculation (min) 48 min      No past medical history on file.  No past surgical history on file.  There were no vitals filed for this visit.      Subjective Assessment - 10/04/16 1234    Subjective Reports she had some increase in pain after last visit.  Right rhomboid muscle region pain.  20% better.     Currently in Pain? Yes   Pain Score 5    Pain Location Scapula   Pain Orientation Right   Pain Type Chronic pain                         [facility] Adult PT Treatment/Exercise - 10/04/16 0001      Shoulder Exercises: Prone   Extension AROM;Right;15 reps   Horizontal ABduction 1 AROM;Strengthening;Right;15 reps     Manual Therapy   Manual Therapy Myofascial release   Manual therapy comments soft tissue mobilizations to medial scapula boarder   Myofascial Release upper trap right   Scapular Mobilization superior/inferior and medial/lateral          Trigger Point Dry Needling - 10/04/16 1307    Consent Given? Yes   Education Handout Provided Yes  discussed risk of pneumothorax   Muscles Treated Upper Body Upper trapezius;Rhomboids;Subscapularis              PT Education - 10/04/16 1312    Education provided Yes   Education Details dry needling after care;  rhomboid stretch; prone Is and Ts   Person(s) Educated Patient   Methods Explanation;Demonstration;Handout   Comprehension Verbalized  understanding;Returned demonstration          PT Short Term Goals - 10/04/16 1400      PT SHORT TERM GOAL #1   Title Pt will be independent in her HEP.   Time 3   Period Weeks   Status On-going     PT SHORT TERM GOAL #2   Title pt will be able to report decreased pain at rest <4/10 in order to improve quality of life and functional mobility.    Time 3   Period Weeks   Status On-going           PT Long Term Goals - 10/04/16 1418      PT LONG TERM GOAL #1   Title Pt will improve FOTO from 34% limitaion to 24% limitation.   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    Time 6   Period Weeks   Status On-going     PT LONG TERM GOAL #3   Title Pt will improve her cervical rotation to the right by 20 degrees in  order to improve driving safety.    Time 6   Period Weeks   Status On-going     PT LONG TERM GOAL #4   Title Pt will improve her R shoulder abduction by 20 degrees in order to improve functional mobility.    Time 6   Period Weeks   Status On-going               Plan - 10/04/16 1315    Clinical Impression Statement The patient has numerous tender points in right rhomboids, subscapularis and upper traps which contribute to her pain and affect scapular kinematics.  Following dry needling and manual therapy, she has improved soft tissue length and scapular mobility.  Therapist closely monitoring response with all interventions.     PT Next Visit Plan assess response to to Dn#1 and continue if helpful;  postual and scapular strengthening;  pec stretching;  foam roll ex      Patient will benefit from skilled therapeutic intervention in order to improve the following deficits and impairments:     Visit Diagnosis: Cervicalgia  Abnormal posture     Problem List There are no active  problems to display for this patient. Lavinia SharpsStacy Jil Penland, PT 10/04/16 2:20 PM Phone: [phone] Fax: [phone]  Vivien PrestoSimpson, Aritzel Krusemark C 10/04/2016, Santiago Bumpers2:20 PM  [facility] [address], [address] [place], [place], [address] Phone: [phone]   Fax:  [phone]  Name: Selena Diaz MRN: [id number] Date of Birth: 01-29-1986

## 2016-10-04 NOTE — Patient Instructions (Signed)
     Trigger Point Dry Needling  . What is Trigger Point Dry Needling (DN)? o DN is a physical therapy technique used to treat muscle pain and dysfunction. Specifically, DN helps deactivate muscle trigger points (muscle knots).  o A thin filiform needle is used to penetrate the skin and stimulate the underlying trigger point. The goal is for a local twitch response (LTR) to occur and for the trigger point to relax. No medication of any kind is injected during the procedure.   . What Does Trigger Point Dry Needling Feel Like?  o The procedure feels different for each individual patient. Some patients report that they do not actually feel the needle enter the skin and overall the process is not painful. Very mild bleeding may occur. However, many patients feel a deep cramping in the muscle in which the needle was inserted. This is the local twitch response.   . How Will I feel after the treatment? o Soreness is normal, and the onset of soreness may not occur for a few hours. Typically this soreness does not last longer than two days.  o Bruising is uncommon, however; ice can be used to decrease any possible bruising.  o In rare cases feeling tired or nauseous after the treatment is normal. In addition, your symptoms may get worse before they get better, this period will typically not last longer than 24 hours.   . What Can I do After My Treatment? o Increase your hydration by drinking more water for the next 24 hours. o You may place ice or heat on the areas treated that have become sore, however, do not use heat on inflamed or bruised areas. Heat often brings more relief post needling. o You can continue your regular activities, but vigorous activity is not recommended initially after the treatment for 24 hours. o DN is best combined with other physical therapy such as strengthening, stretching, and other therapies.    [facility] [address], [address] [place], [place] [address] Phone # [phone] Fax [phone] 

## 2016-10-05 ENCOUNTER — Other Ambulatory Visit: Payer: Self-pay | Admitting: Physician Assistant

## 2016-10-05 MED ORDER — ERGOCALCIFEROL 1.25 MG (50000 UT) PO CAPS: 50000.0000 [IU] | ORAL_CAPSULE | ORAL | 1 refills | Status: DC

## 2016-10-06 ENCOUNTER — Ambulatory Visit: Payer: PRIVATE HEALTH INSURANCE | Attending: Physician Assistant | Admitting: Physical Therapy

## 2016-10-06 ENCOUNTER — Encounter: Payer: Self-pay | Admitting: Physical Therapy

## 2016-10-06 DIAGNOSIS — R293 Abnormal posture: Secondary | ICD-10-CM | POA: Diagnosis present

## 2016-10-06 DIAGNOSIS — M542 Cervicalgia: Secondary | ICD-10-CM | POA: Diagnosis present

## 2016-10-06 NOTE — Therapy (Signed)
[facility] [address], [address] [place], [place], [address] Phone: [phone]   Fax:  [phone]  Physical Therapy Treatment  Patient Details  Name: Selena DeedsReem Diaz MRN: [id number] Date of Birth: [birth year] Referring Provider: Trena PlattStephanie English, PA  Encounter Date: 10/06/2016      PT End of Session - 10/06/16 1237    Visit Number 5   Number of Visits 12   Date for PT Re-Evaluation 11/21/18   Authorization Type 12 visits, [employer]   PT Start Time 1233   PT Stop Time 1315   PT Time Calculation (min) 42 min   Activity Tolerance Patient tolerated treatment well   Behavior During Therapy [facility] for tasks assessed/performed      History reviewed. No pertinent past medical history.  History reviewed. No pertinent surgical history.  There were no vitals filed for this visit.      Subjective Assessment - 10/06/16 1234    Subjective Pt reports feeling good after dry needling, had no adverse effects   Limitations Sitting;House hold activities;Lifting   How long can you sit comfortably? 20 minutes   How long can you stand comfortably? unlimited   How long can you walk comfortably? unlimited without holding her purse.    Patient Stated Goals stop hurting in my arm and return to PLOF   Currently in Pain? Yes   Pain Score 4    Pain Location Scapula   Pain Orientation Right   Pain Descriptors / Indicators Aching;Stabbing   Pain Type Chronic pain   Pain Onset More than a month ago   Pain Frequency Constant   Multiple Pain Sites No                         [facility] Adult PT Treatment/Exercise - 10/06/16 0001      Neck Exercises: Machines for Strengthening   UBE (Upper Arm Bike) L1 x 6 minutes (3/3)     Neck Exercises: Theraband   Shoulder Extension 20 reps;Red   Rows Red;20 reps     Shoulder Exercises: Supine   Horizontal ABduction Strengthening;Both;20 reps;Theraband   Theraband Level (Shoulder Horizontal  ABduction) Level 1 (Yellow)   External Rotation Strengthening;Both;20 reps;Theraband   Theraband Level (Shoulder External Rotation) Level 1 (Yellow)   Flexion Both;20 reps;Theraband  Narrow and widr grip   Theraband Level (Shoulder Flexion) Level 1 (Yellow)     Shoulder Exercises: Seated   Horizontal ABduction Strengthening;Both;20 reps   Theraband Level (Shoulder Horizontal ABduction) Level 1 (Yellow)     Shoulder Exercises: Prone   External Rotation Strengthening;Both;20 reps   Theraband Level (Shoulder External Rotation) Level 1 (Yellow)     Shoulder Exercises: Standing   Flexion AAROM;Both;10 reps   Extension AAROM;Both;10 reps                  PT Short Term Goals - 10/04/16 1400      PT SHORT TERM GOAL #1   Title Pt will be independent in her HEP.   Time 3   Period Weeks   Status On-going     PT SHORT TERM GOAL #2   Title pt will be able to report decreased pain at rest <4/10 in order to improve quality of life and functional mobility.    Time 3   Period Weeks   Status On-going           PT Long Term Goals - 10/04/16 1418  PT LONG TERM GOAL #1   Title Pt will improve FOTO from 34% limitaion to 24% limitation.   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    Time 6   Period Weeks   Status On-going     PT LONG TERM GOAL #3   Title Pt will improve her cervical rotation to the right by 20 degrees in order to improve driving safety.    Time 6   Period Weeks   Status On-going     PT LONG TERM GOAL #4   Title Pt will improve her R shoulder abduction by 20 degrees in order to improve functional mobility.    Time 6   Period Weeks   Status On-going               Plan - 10/06/16 1302    Clinical Impression Statement Pt responded well to dry needling. Pt reports having  increased Rt rhomboid pain when doing exercises or with doing dishes. Pt able to complete all exercises needing verbal cues for posture. Pt will continue to benefit from skilled therapy for shoulder and upper back strength and stability.    Rehab Potential Excellent   PT Frequency 2x / week   PT Duration 6 weeks   PT Treatment/Interventions ADLs/Self Care Home Management;Patient/family education;Dry needling;Taping;Electrical Stimulation;Iontophoresis 4mg /ml Dexamethasone;Moist Heat;Manual techniques;Passive range of motion;Cryotherapy;Functional mobility training;Therapeutic activities;Therapeutic exercise   PT Next Visit Plan Serratus strenghtening, shoulder strengthening, continue dry needling if treated by PT   Consulted and Agree with Plan of Care Patient      Patient will benefit from skilled therapeutic intervention in order to improve the following deficits and impairments:  Pain, Impaired UE functional use, Decreased activity tolerance, Decreased range of motion, Postural dysfunction, Improper body mechanics  Visit Diagnosis: Cervicalgia  Abnormal posture     Problem List There are no active problems to display for this patient.   Dessa PhiKatherine Matthews PTA 10/06/2016, 1:21 PM  [facility] [address], [address] [place], [place], [address] Phone: [phone]   Fax:  [phone]  Name: Selena DeedsReem Diaz MRN: [id number] Date of Birth: 30-Mar-1986

## 2016-10-11 ENCOUNTER — Ambulatory Visit: Payer: PRIVATE HEALTH INSURANCE | Admitting: Physical Therapy

## 2016-10-11 DIAGNOSIS — M542 Cervicalgia: Secondary | ICD-10-CM

## 2016-10-11 DIAGNOSIS — R293 Abnormal posture: Secondary | ICD-10-CM

## 2016-10-11 NOTE — Therapy (Signed)
[facility] [address], [address] [place], [place], [address] Phone: [phone]   Fax:  [phone]  Physical Therapy Treatment  Patient Details  Name: Selena Diaz MRN: [id number] Date of Birth: [birth year] Referring Provider: Trena PlattStephanie English, PA  Encounter Date: 10/11/2016      PT End of Session - 10/11/16 1313    Visit Number 6   Number of Visits 12   Date for PT Re-Evaluation 11/21/18   Authorization Type 12 visits, [employer]   PT Start Time 1235   PT Stop Time 1325   PT Time Calculation (min) 50 min   Activity Tolerance Patient tolerated treatment well      No past medical history on file.  No past surgical history on file.  There were no vitals filed for this visit.      Subjective Assessment - 10/11/16 1235    Subjective Patient reports the dry needling helped but the exercises seemed to aggravate so she stopped doing her home ex.  Feels OK during ex but hurts later.  Right  levator and rhomboid region pain.  Tightness in right upper trap.     Currently in Pain? Yes   Pain Score 4    Pain Location Neck   Pain Orientation Right   Pain Type Chronic pain                         [facility] Adult PT Treatment/Exercise - 10/11/16 0001      Moist Heat Therapy   Number Minutes Moist Heat 15 Minutes   Moist Heat Location Cervical     Electrical Stimulation   Electrical Stimulation Location right scapula   Electrical Stimulation Action Hi volt   Electrical Stimulation Parameters Hi volt 110 volts 15 min prone   Electrical Stimulation Goals Pain     Manual Therapy   Manual Therapy Joint mobilization   Manual therapy comments soft tissue mobilizations to medial scapula boarder   Joint Mobilization thoracic upper and mid mob with movement in sitting 10x each grade 3   Scapular Mobilization superior/inferior and medial/lateral          Trigger Point Dry Needling - 10/11/16 1312    Consent Given?  Yes   Muscles Treated Upper Body Upper trapezius;Levator scapulae;Rhomboids;Infraspinatus;Subscapularis   Upper Trapezius Response Twitch reponse elicited;Palpable increased muscle length   Levator Scapulae Response Twitch response elicited;Palpable increased muscle length   Rhomboids Response Twitch response elicited;Palpable increased muscle length   Infraspinatus Response Palpable increased muscle length   Subscapularis Response Palpable increased muscle length       Right only.         PT Short Term Goals - 10/11/16 1356      PT SHORT TERM GOAL #1   Title Pt will be independent in her HEP.   Time 3   Period Weeks   Status On-going     PT SHORT TERM GOAL #2   Title pt will be able to report decreased pain at rest <4/10 in order to improve quality of life and functional mobility.    Time 3   Period Weeks   Status On-going           PT Long Term Goals - 10/11/16 1357      PT LONG TERM GOAL #1   Title Pt will improve FOTO from 34% limitaion to 24% limitation.   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    Time 6   Period Weeks   Status On-going     PT LONG TERM GOAL #3   Title Pt will improve her cervical rotation to the right by 20 degrees in order to improve driving safety.    Time 6   Period Weeks   Status On-going     PT LONG TERM GOAL #4   Title Pt will improve her R shoulder abduction by 20 degrees in order to improve functional mobility.    Time 6   Period Weeks   Status On-going               Plan - 10/11/16 1353    Clinical Impression Statement The patient has continued tender points in right levator scap and rhomboid muscles which contribute to pain with exercise and to stand to do dishses.  Following dry needling and manual therapy, she has improved soft tissue lengths.   Decreased thoracic extension mobility.  Therapist closely monitoring response with all interventions.     PT Next Visit Plan assess response to Dn#2;  manual therapy; e-stim/heat as needed;  periscapular strengthening as tolerated;  postural taping?;  recheck shoulder AROM and cervical AROM      Patient will benefit from skilled therapeutic intervention in order to improve the following deficits and impairments:     Visit Diagnosis: Cervicalgia  Abnormal posture     Problem List There are no active problems to display for this patient.  Lavinia SharpsStacy Desaray Marschner, PT 10/11/16 1:59 PM Phone: [phone] Fax: [phone]  Vivien PrestoSimpson, Srihith Aquilino C 10/11/2016, 1:58 PM  [facility] [address], [address] [place], [place], [address] Phone: [phone]   Fax:  [phone]  Name: Selena Diaz MRN: [id number] Date of Birth: 10-Oct-1986

## 2016-10-13 ENCOUNTER — Encounter: Payer: Self-pay | Admitting: Physical Therapy

## 2016-10-13 ENCOUNTER — Ambulatory Visit: Payer: PRIVATE HEALTH INSURANCE | Admitting: Physical Therapy

## 2016-10-13 DIAGNOSIS — M542 Cervicalgia: Secondary | ICD-10-CM | POA: Diagnosis not present

## 2016-10-13 DIAGNOSIS — R293 Abnormal posture: Secondary | ICD-10-CM

## 2016-10-13 NOTE — Therapy (Signed)
[facility] [address] [place], [place], [address] Phone: [phone]   Fax:  [phone]  Physical Therapy Treatment  Patient Details  Name: Selena DeedsReem Diaz MRN: [id number] Date of Birth: 02-Aug-1986 Referring Provider: Trena PlattStephanie English, PA  Encounter Date: 10/13/2016      PT End of Session - 10/13/16 1242    Visit Number 7   Number of Visits 12   Date for PT Re-Evaluation 11/21/18   Authorization Type 12 visits, [employer]   PT Start Time 1238   PT Stop Time 1332   PT Time Calculation (min) 54 min   Activity Tolerance Patient tolerated treatment well   Behavior During Therapy [facility] for tasks assessed/performed      History reviewed. No pertinent past medical history.  History reviewed. No pertinent surgical history.  There were no vitals filed for this visit.      Subjective Assessment - 10/13/16 1240    Subjective Pt reports dry needling helped, usually has pain after leaving on Thursdays.    Limitations Sitting;House hold activities;Lifting   How long can you sit comfortably? 20 minutes   How long can you stand comfortably? unlimited   How long can you walk comfortably? unlimited without holding her purse.    Patient Stated Goals stop hurting in my arm and return to PLOF   Currently in Pain? Yes   Pain Score 4    Pain Location Neck   Pain Orientation Right   Pain Descriptors / Indicators Aching;Stabbing   Pain Type Chronic pain   Pain Onset More than a month ago   Pain Frequency Constant                         [facility] Adult PT Treatment/Exercise - 10/13/16 0001      Neck Exercises: Machines for Strengthening   UBE (Upper Arm Bike) L1 x 6 minutes (3/3)  Therapist present to discuss treatment     Shoulder Exercises: Prone   Extension Strengthening;Both;10 reps   Horizontal ABduction 1 Strengthening;Both;10 reps     Shoulder Exercises: Standing   Other Standing Exercises Shoulder  adduction  Yellow tband 2x10     Moist Heat Therapy   Number Minutes Moist Heat 15 Minutes   Moist Heat Location Cervical     Electrical Stimulation   Electrical Stimulation Location right scapula   Electrical Stimulation Action IFC   Electrical Stimulation Parameters To tolerance   Electrical Stimulation Goals Pain     Manual Therapy   Manual Therapy Soft tissue mobilization   Manual therapy comments Pt prone   Joint Mobilization to Rt rhomoids and low traps                  PT Short Term Goals - 10/11/16 1356      PT SHORT TERM GOAL #1   Title Pt will be independent in her HEP.   Time 3   Period Weeks   Status On-going     PT SHORT TERM GOAL #2   Title pt will be able to report decreased pain at rest <4/10 in order to improve quality of life and functional mobility.    Time 3   Period Weeks   Status On-going           PT Long Term Goals - 10/11/16 1357      PT LONG TERM GOAL #1   Title Pt will improve FOTO from 34% limitaion to 24%  limitation.   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    Time 6   Period Weeks   Status On-going     PT LONG TERM GOAL #3   Title Pt will improve her cervical rotation to the right by 20 degrees in order to improve driving safety.    Time 6   Period Weeks   Status On-going     PT LONG TERM GOAL #4   Title Pt will improve her R shoulder abduction by 20 degrees in order to improve functional mobility.    Time 6   Period Weeks   Status On-going               Plan - 10/13/16 1327    Clinical Impression Statement Pt reports increased pain after a lot of exercises. Able to tolerate exercises to strengthen back. Soft tissue massge to decrease tender points in back. Pt will contiue to benefit from skilled therapy for postural training and scapular  stability.    Rehab Potential Excellent   PT Frequency 2x / week   PT Duration 6 weeks   PT Treatment/Interventions ADLs/Self Care Home Management;Patient/family education;Dry needling;Taping;Electrical Stimulation;Iontophoresis 4mg /ml Dexamethasone;Moist Heat;Manual techniques;Passive range of motion;Cryotherapy;Functional mobility training;Therapeutic activities;Therapeutic exercise   PT Next Visit Plan Manual therapy, modalities as needed, postural training as tolerate.    PT Home Exercise Plan cervical rotaion, upper trap stretch, corner stretch, rows, wall angles   Consulted and Agree with Plan of Care Patient      Patient will benefit from skilled therapeutic intervention in order to improve the following deficits and impairments:  Pain, Impaired UE functional use, Decreased activity tolerance, Decreased range of motion, Postural dysfunction, Improper body mechanics  Visit Diagnosis: Cervicalgia  Abnormal posture     Problem List There are no active problems to display for this patient.   Dessa PhiKatherine Ruel Dimmick PTA 10/13/2016, 1:35 PM  [facility] [address] [place], [place], [address] Phone: [phone]   Fax:  [phone]  Name: Selena DeedsReem Diaz MRN: [id number] Date of Birth: 06-23-1986

## 2016-10-17 ENCOUNTER — Ambulatory Visit (INDEPENDENT_AMBULATORY_CARE_PROVIDER_SITE_OTHER): Payer: PPO | Admitting: Physician Assistant

## 2016-10-17 VITALS — BP 100/60 | HR 84 | Temp 97.7°F | Resp 16 | Ht 61.5 in | Wt 139.6 lb

## 2016-10-17 DIAGNOSIS — M79641 Pain in right hand: Secondary | ICD-10-CM

## 2016-10-17 DIAGNOSIS — M67432 Ganglion, left wrist: Secondary | ICD-10-CM

## 2016-10-17 DIAGNOSIS — E559 Vitamin D deficiency, unspecified: Secondary | ICD-10-CM | POA: Insufficient documentation

## 2016-10-17 DIAGNOSIS — M67431 Ganglion, right wrist: Secondary | ICD-10-CM | POA: Diagnosis not present

## 2016-10-17 DIAGNOSIS — M79642 Pain in left hand: Secondary | ICD-10-CM | POA: Diagnosis not present

## 2016-10-17 NOTE — Patient Instructions (Addendum)
IF you received an x-ray today, you will receive an invoice from [facility]. Please contact [facility] at [phone] with questions or concerns regarding your invoice.   IF you received labwork today, you will receive an invoice from [facility]. Please contact [facility] at [phone] with questions or concerns regarding your invoice.   Our billing staff will not be able to assist you with questions regarding bills from these companies.  You will be contacted with the lab results as soon as they are available. The fastest way to get your results is to activate your My Chart account. Instructions are located on the last page of this paperwork. If you have not heard from us regarding the results in 2 weeks, please contact this office.      Ganglion Cyst A ganglion cyst is a noncancerous, fluid-filled lump that occurs near joints or tendons. The ganglion cyst grows out of a joint or the lining of a tendon. It most often develops in the hand or wrist, but it can also develop in the shoulder, elbow, hip, knee, ankle, or foot. The round or oval ganglion cyst can be the size of a pea or larger than a grape. Increased activity may enlarge the size of the cyst because more fluid starts to build up.  CAUSES It is not known what causes a ganglion cyst to grow. However, it may be related to:  Inflammation or irritation around the joint.  An injury.  Repetitive movements or overuse.  Arthritis. RISK FACTORS Risk factors include:  Being a woman.  Being age 28-50. SIGNS AND SYMPTOMS Symptoms may include:   A lump. This most often appears on the hand or wrist, but it can occur in other areas of the body.  Tingling.  Pain.  Numbness.  Muscle weakness.  Weak grip.  Less movement in a joint. DIAGNOSIS Ganglion cysts are most often diagnosed based on a physical exam. Your health care provider will feel the lump and may shine a light  alongside it. If it is a ganglion cyst, a light often shines through it. Your health care provider may order an X-ray, ultrasound, or MRI to rule out other conditions. TREATMENT Ganglion cysts usually go away on their own without treatment. If pain or other symptoms are involved, treatment may be needed. Treatment is also needed if the ganglion cyst limits your movement or if it gets infected. Treatment may include:  Wearing a brace or splint on your wrist or finger.  Taking anti-inflammatory medicine.  Draining fluid from the lump with a needle (aspiration).  Injecting a steroid into the joint.  Surgery to remove the ganglion cyst. HOME CARE INSTRUCTIONS  Do not press on the ganglion cyst, poke it with a needle, or hit it.  Take medicines only as directed by your health care provider.  Wear your brace or splint as directed by your health care provider.  Watch your ganglion cyst for any changes.  Keep all follow-up visits as directed by your health care provider. This is important. SEEK MEDICAL CARE IF:  Your ganglion cyst becomes larger or more painful.  You have increased redness, red streaks, or swelling.  You have pus coming from the lump.  You have weakness or numbness in the affected area.  You have a fever or chills.   This information is not intended to replace advice given to you by your health care provider. Make sure you discuss any questions you have with your health  care provider.   Document Released: 11/18/2000 Document Revised: 12/12/2014 Document Reviewed: 05/06/2014 Elsevier Interactive Patient Education [employer]2016 Elsevier Inc.

## 2016-10-17 NOTE — Progress Notes (Signed)
Subjective:    Patient ID: Selena Diaz, female    DOB: 1986-04-07, 30 y.o.   MRN: [id number]   Chief Complaint  Patient presents with  . Hand Pain    pain/tingling in both hands x1 week; more in right hand; wears brace for comfort   HPI: Presents for bilateral wrist pain which she has had for years which has been flaring for the past week. Denies injury or trauma. States her right wrist is worse than her left. Notices that the pain flares when she is writing or typing for long time periods. Denies numbness, tingling, loss of strength or motor control, redness, swelling, or erythema. Has noticed bilateral lumps in her wrists which are present when she flexes them and which are point-tender to palpation. States the pain is increased when sleeping. She was provided a wrist splint in the past to help with the pain which provides some brief relief but recently has noticed the pain will come back. Denies use of medications at home to help. Denies use of stretches at home to help.  Review of Systems Pertinent ROS mentioned above in HPI, otherwise negative.  No Known Allergies  Prior to Admission medications   Medication Sig Start Date End Date Taking? Authorizing Provider  ergocalciferol (VITAMIN D2) 50000 units capsule Take 1 capsule (50,000 Units total) by mouth once a week. 10/05/16  Yes Garnetta BuddyStephanie D English, PA   Patient Active Problem List   Diagnosis Date Noted  . Vitamin D deficiency 10/17/2016       Objective: Blood pressure 100/60, pulse 84, temperature 97.7 F (36.5 C), temperature source Oral, resp. rate 16, height 5' 1.5" (1.562 m), weight 139 lb 9.6 oz (63.3 kg), last menstrual period 10/13/2016, SpO2 100 %.   Physical Exam  Constitutional: She is oriented to person, place, and time. She appears well-developed and well-nourished. No distress.  HENT:  Head: Normocephalic and atraumatic.  Cardiovascular:  Pulses:      Radial pulses are 2+ on the right side, and 2+ on the left  side.  Musculoskeletal: She exhibits tenderness.       Right wrist: She exhibits decreased range of motion and tenderness. She exhibits no bony tenderness, no swelling, no effusion, no crepitus, no deformity and no laceration.       Left wrist: She exhibits decreased range of motion and tenderness. She exhibits no bony tenderness, no swelling, no effusion, no crepitus, no deformity and no laceration.       Arms:      Right hand: She exhibits normal range of motion, no tenderness, no bony tenderness, normal capillary refill, no deformity, no laceration and no swelling. Normal sensation noted. Normal strength noted.       Left hand: She exhibits normal range of motion, no tenderness, no bony tenderness, normal capillary refill, no deformity, no laceration and no swelling. Normal sensation noted. Normal strength noted.  ROM decreased, pain in bilateral when trying to perform Phalen's test. No numbness/tingling.  Neurological: She is alert and oriented to person, place, and time.  Reflex Scores:      Brachioradialis reflexes are 2+ on the right side. Skin: Skin is warm and dry. No rash noted. She is not diaphoretic. No erythema. No pallor.  Psychiatric: She has a normal mood and affect. Her behavior is normal.       Assessment & Plan:  1. Bilateral hand pain Hand pain due to bilateral ganglion cysts, referral for hand surgery provided. - Ambulatory referral to Hand  Surgery  2. Ganglion cyst of both wrists Advised to continue NSAIDs which she said she has at home. Hand surgery referral provided. - Ambulatory referral to Hand Surgery

## 2016-10-17 NOTE — Progress Notes (Signed)
   Patient ID: Selena Diaz, female    DOB: 1986-02-23, [age] y.o.   MRN: [id number]  PCP: No PCP Per Patient  Chief Complaint  Patient presents with  . Hand Pain    pain/tingling in both hands x1 week; more in right hand; wears brace for comfort    Subjective:    HPI Presents for evaluation of Pain in both hands.  Pain has been present for several years, with worsening x 1 week, R>L, associated with a lump on the dorsum of each wrist.   Worse with typing for long periods and when sleeping.  No numbness, tingling, weakness. Wrist splint ineffective.  Sister has similar symptoms and was advised she needed surgery, but did not proceed for concern over possible scaring.    Review of Systems As above.    Patient Active Problem List   Diagnosis Date Noted  . Vitamin D deficiency 10/17/2016     Prior to Admission medications   Medication Sig Start Date End Date Taking? Authorizing Provider  ergocalciferol (VITAMIN D2) 50000 units capsule Take 1 capsule (50,000 Units total) by mouth once a week. 10/05/16  Yes Stephanie D English, PA     No Known Allergies     Objective:  Physical Exam  Constitutional: She is oriented to person, place, and time. She appears well-developed and well-nourished. She is active and cooperative. No distress.  BP 100/60   Pulse 84   Temp 97.7 F (36.5 C) (Oral)   Resp 16   Ht 5' 1.5" (1.562 m)   Wt 139 lb 9.6 oz (63.3 kg)   LMP 10/13/2016   SpO2 100%   BMI 25.95 kg/m    Eyes: Conjunctivae are normal.  Pulmonary/Chest: Effort normal.  Musculoskeletal:       Right wrist: She exhibits decreased range of motion and tenderness. She exhibits no bony tenderness, no swelling, no effusion, no crepitus and no laceration.       Left wrist: She exhibits decreased range of motion and tenderness. She exhibits no bony tenderness, no swelling, no effusion, no crepitus and no laceration.  1 cm lump on the dorsum of both wrists, R>L, consistent with  ganglion cyst  Neurological: She is alert and oriented to person, place, and time.  Psychiatric: She has a normal mood and affect. Her speech is normal and behavior is normal.           Assessment & Plan:   1. Bilateral hand pain 2. Ganglion cyst of both wrists Declines NSAIDS. Refer to hand surgery. May be interested in injections before considering surgical intervention. - Ambulatory referral to Hand Surgery    Fernande Brashelle S. Kiing Deakin, PA-C Physician Assistant-Certified [facility]

## 2016-10-18 ENCOUNTER — Encounter: Payer: Self-pay | Admitting: Physical Therapy

## 2016-10-18 ENCOUNTER — Ambulatory Visit: Payer: PRIVATE HEALTH INSURANCE | Admitting: Physical Therapy

## 2016-10-18 DIAGNOSIS — R293 Abnormal posture: Secondary | ICD-10-CM

## 2016-10-18 DIAGNOSIS — M542 Cervicalgia: Secondary | ICD-10-CM | POA: Diagnosis not present

## 2016-10-18 NOTE — Therapy (Signed)
[facility] [address] [place], [place], [address] Phone: [phone]   Fax:  [phone]  Physical Therapy Treatment  Patient Details  Name: Selena DeedsReem Maj MRN: [id number] Date of Birth: [birth year] Referring Provider: Trena PlattStephanie English, PA  Encounter Date: 10/18/2016      PT End of Session - 10/18/16 1241    Visit Number 8   Number of Visits 12   Date for PT Re-Evaluation 11/21/18   Authorization Type 12 visits, [employer]   PT Start Time 1233   PT Stop Time 1315   PT Time Calculation (min) 42 min   Activity Tolerance Patient tolerated treatment well   Behavior During Therapy [facility] for tasks assessed/performed      History reviewed. No pertinent past medical history.  History reviewed. No pertinent surgical history.  There were no vitals filed for this visit.      Subjective Assessment - 10/18/16 1320    Subjective Pt reports some increased pain today. Believes soft tissue work and dry needling are helping the most.    Limitations Sitting;House hold activities;Lifting   How long can you sit comfortably? 20 minutes   How long can you stand comfortably? unlimited   How long can you walk comfortably? unlimited without holding her purse.    Patient Stated Goals stop hurting in my arm and return to PLOF   Currently in Pain? Yes   Pain Score 5    Pain Location Neck   Pain Orientation Right   Pain Descriptors / Indicators Aching;Stabbing   Pain Type Chronic pain                         [facility] Adult PT Treatment/Exercise - 10/18/16 0001      Neck Exercises: Machines for Strengthening   UBE (Upper Arm Bike) L1 x 6 minutes (3/3)  Therapist present to discuss treatment     Shoulder Exercises: Prone   Flexion Strengthening;Right;20 reps  #1   Extension Strengthening;Both;10 reps   External Rotation Strengthening;Right;20 reps;Theraband   Theraband Level (Shoulder External Rotation) Level 2 (Red)    Internal Rotation Strengthening;Right;20 reps   Theraband Level (Shoulder Internal Rotation) Level 2 (Red)   Horizontal ABduction 1 Strengthening;Both;10 reps  #1     Shoulder Exercises: Standing   Other Standing Exercises Serratus wall slide     Manual Therapy   Manual Therapy Soft tissue mobilization   Manual therapy comments Pt prone   Joint Mobilization to Rt rhomoids and low traps                  PT Short Term Goals - 10/18/16 1239      PT SHORT TERM GOAL #1   Title Pt will be independent in her HEP.   Time 3   Period Weeks   Status Achieved     PT SHORT TERM GOAL #2   Title pt will be able to report decreased pain at rest <4/10 in order to improve quality of life and functional mobility.    Time 3   Period Weeks   Status On-going           PT Long Term Goals - 10/18/16 1239      PT LONG TERM GOAL #1   Title Pt will improve FOTO from 34% limitaion to 24% limitation.   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    Time 6   Period Weeks   Status On-going     PT LONG TERM GOAL #3   Title Pt will improve her cervical rotation to the right by 20 degrees in order to improve driving safety.    Baseline R rotation 40 degrees   Time 6   Period Weeks   Status On-going     PT LONG TERM GOAL #4   Title Pt will improve her R shoulder abduction by 20 degrees in order to improve functional mobility.    Baseline R abd 142 degrees, L abd 162 degrees   Time 6   Period Weeks   Status On-going               Plan - 10/18/16 1318    Clinical Impression Statement Pt continues to have tightness and trigger point in Rt shoulder and upper back. Able to complete all exercises well and is progressing with strength. Pt will contiue to benefit from skilled therapy for shoulder strength and  stabliity and management of pain.    Rehab Potential Excellent   PT Frequency 2x / week   PT Duration 6 weeks   PT Treatment/Interventions ADLs/Self Care Home Management;Patient/family education;Dry needling;Taping;Electrical Stimulation;Iontophoresis 4mg /ml Dexamethasone;Moist Heat;Manual techniques;Passive range of motion;Cryotherapy;Functional mobility training;Therapeutic activities;Therapeutic exercise   PT Next Visit Plan Dry needling, postural training, shoulder stability   Consulted and Agree with Plan of Care Patient      Patient will benefit from skilled therapeutic intervention in order to improve the following deficits and impairments:  Pain, Impaired UE functional use, Decreased activity tolerance, Decreased range of motion, Postural dysfunction, Improper body mechanics  Visit Diagnosis: Cervicalgia  Abnormal posture     Problem List Patient Active Problem List   Diagnosis Date Noted  . Vitamin D deficiency 10/17/2016    Dessa PhiKatherine Kyce Ging PTA 10/18/2016, 1:21 PM  [facility] [address], [address] [place], [place], [address] Phone: [phone]   Fax:  [phone]  Name: Selena DeedsReem Dehne MRN: [id number] Date of Birth: [birth year]

## 2016-10-20 ENCOUNTER — Ambulatory Visit: Payer: PRIVATE HEALTH INSURANCE | Admitting: Physical Therapy

## 2016-10-20 DIAGNOSIS — M542 Cervicalgia: Secondary | ICD-10-CM

## 2016-10-20 DIAGNOSIS — R293 Abnormal posture: Secondary | ICD-10-CM

## 2016-10-20 NOTE — Therapy (Signed)
[facility] [address], [address] [place], [place], [address] Phone: [phone]   Fax:  [phone]  Physical Therapy Treatment  Patient Details  Name: Selena Diaz MRN: [id number] Date of Birth: Dec 08, 1985 Referring Provider: Trena PlattStephanie English, PA  Encounter Date: 10/20/2016      PT End of Session - 10/20/16 1317    Visit Number 9   Number of Visits 12   Date for PT Re-Evaluation 11/21/18   Authorization Type 12 visits, [employer]   PT Start Time 1243  patient late   PT Stop Time 1325   PT Time Calculation (min) 42 min   Activity Tolerance Patient tolerated treatment well      No past medical history on file.  No past surgical history on file.  There were no vitals filed for this visit.      Subjective Assessment - 10/20/16 1244    Subjective The patient arrives 13 min late.  She reports pain returned after 2 days following DN but massage helped last time.     Currently in Pain? Yes   Pain Score 5    Pain Location Scapula   Pain Orientation Right   Pain Type Chronic pain   Aggravating Factors  washing dishes, doing any kind of work                         [facility] Adult PT Treatment/Exercise - 10/20/16 0001      Moist Heat Therapy   Number Minutes Moist Heat 10 Minutes   Moist Heat Location Cervical  right shoulder blade     Manual Therapy   Manual therapy comments soft tissue mobilizations to medial scapula boarder   Joint Mobilization to Rt upper traps, levator scap, infraspinatus rhomoids and low traps   Myofascial Release trigger point release rhomboids   Scapular Mobilization superior/inferior and medial/lateral          Trigger Point Dry Needling - 10/20/16 1316    Upper Trapezius Response Twitch reponse elicited;Palpable increased muscle length   Rhomboids Response Palpable increased muscle length   Subscapularis Response Twitch response elicited;Palpable increased muscle length       Right only          PT Short Term Goals - 10/20/16 1323      PT SHORT TERM GOAL #1   Title Pt will be independent in her HEP.   Status Achieved     PT SHORT TERM GOAL #2   Title pt will be able to report decreased pain at rest <4/10 in order to improve quality of life and functional mobility.    Time 3   Period Weeks   Status On-going           PT Long Term Goals - 10/20/16 1323      PT LONG TERM GOAL #1   Title Pt will improve FOTO from 34% limitaion to 24% limitation.   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    Time 6   Period Weeks   Status On-going     PT LONG TERM GOAL #3   Title Pt will improve her cervical rotation to the right by 20 degrees in order to improve driving safety.    Time  6   Period Weeks   Status On-going     PT LONG TERM GOAL #4   Title Pt will improve her R shoulder abduction by 20 degrees in order to improve functional mobility.    Time 6   Period Weeks   Status On-going               Plan - 10/20/16 1318    Clinical Impression Statement The patient reports the best pain relief has come from dry needling and manual therapy.  She reports a good understanding of her HEP and reports compliance which is needed for optimum long term relief.  Decreased tender points in upper traps but rhomboid trigger points persist which limits scapular mobility.  Following treatment session, improved muscle length noted.  Therapist closely monitoring response with all interventions.     PT Next Visit Plan Dry needling #4,  soft tissue mobilization;  scapular mobilization; ? taping; continue 1x/week for 3 weeks;   postural training, shoulder stability      Patient will benefit from skilled therapeutic intervention in order to improve the following deficits and impairments:      Visit Diagnosis: Cervicalgia  Abnormal posture     Problem List Patient Active Problem List   Diagnosis Date Noted  . Vitamin D deficiency 10/17/2016   Lavinia SharpsStacy Stran Raper, PT 10/20/16 1:26 PM Phone: [phone] Fax: [phone]  Vivien PrestoSimpson, Catelyn Friel C 10/20/2016, 1:25 PM  [facility] [address], [address] [place], [place], [address] Phone: [phone]   Fax:  [phone]  Name: Selena DeedsReem Diaz MRN: [id number] Date of Birth: December 26, 1985

## 2016-11-01 ENCOUNTER — Ambulatory Visit: Payer: PRIVATE HEALTH INSURANCE | Admitting: Physical Therapy

## 2016-11-01 ENCOUNTER — Ambulatory Visit (INDEPENDENT_AMBULATORY_CARE_PROVIDER_SITE_OTHER): Payer: PPO | Admitting: Orthopaedic Surgery

## 2016-11-01 ENCOUNTER — Encounter (INDEPENDENT_AMBULATORY_CARE_PROVIDER_SITE_OTHER): Payer: Self-pay | Admitting: Orthopaedic Surgery

## 2016-11-01 VITALS — Ht 61.0 in | Wt 136.7 lb

## 2016-11-01 DIAGNOSIS — R293 Abnormal posture: Secondary | ICD-10-CM

## 2016-11-01 DIAGNOSIS — M542 Cervicalgia: Secondary | ICD-10-CM | POA: Diagnosis not present

## 2016-11-01 DIAGNOSIS — M67431 Ganglion, right wrist: Secondary | ICD-10-CM | POA: Diagnosis not present

## 2016-11-01 MED ORDER — LIDOCAINE HCL 1 % IJ SOLN
1.0000 mL | INTRAMUSCULAR | Status: AC | PRN
  Administered 2016-11-01: 1 mL

## 2016-11-01 MED ORDER — METHYLPREDNISOLONE ACETATE 40 MG/ML IJ SUSP
40.0000 mg | INTRAMUSCULAR | Status: AC | PRN
  Administered 2016-11-01: 40 mg via INTRA_ARTICULAR

## 2016-11-01 MED ORDER — BUPIVACAINE HCL 0.5 % IJ SOLN
1.0000 mL | INTRAMUSCULAR | Status: AC | PRN
  Administered 2016-11-01: 1 mL via INTRA_ARTICULAR

## 2016-11-01 NOTE — Progress Notes (Signed)
Office Visit Note   Patient: Selena DeedsReem Kauer           Date of Birth: 1986-09-12           MRN: [id number] Visit Date: 11/01/2016              Requested by: Porfirio Oarhelle Jeffery, PA-C [address] [place], [place] [address] PCP: No PCP Per Patient   Assessment & Plan: Visit Diagnoses: No diagnosis found.  Plan: For the right wrist we attempted aspiration and we were only able to get a couple drops of gelatinous fluid. It was decompressed by squeezing. I did inject Depo-Medrol injection today into the right wrist under sterile conditions and applied a wrist brace to the hand. For the left wrist she did not want another aspiration and injection. Follow up with me as needed. Did discuss surgery briefly and its risks benefits alternatives and expectations.  Follow-Up Instructions: Return if symptoms worsen or fail to improve.   Orders:  No orders of the defined types were placed in this encounter.  No orders of the defined types were placed in this encounter.     Procedures: Medium Joint Inj Date/Time: 11/01/2016 6:18 PM Performed by: Tarry KosXU, Ryver Poblete M Authorized by: Tarry KosXU, Xaniyah Buchholz M   Consent Given by:  Patient Timeout: prior to procedure the correct patient, procedure, and site was verified   Indications:  Pain Location:  Wrist Site:  R radiocarpal Prep: patient was prepped and draped in usual sterile fashion   Needle Size:  25 G Approach:  Dorsal Ultrasound Guided: No   Fluoroscopic Guidance: No   Medications:  1 mL lidocaine 1 %; 1 mL bupivacaine 0.5 %; 40 mg methylPREDNISolone acetate 40 MG/ML Aspiration Attempted: Yes       Clinical Data: No additional findings.   Subjective: Chief Complaint  Patient presents with  . Right Hand - Pain    Ganglion cyst  . Left Hand - Pain    Ganglion cyst    Patient is a very pleasant 30 year old [place] female who comes in with bilateral dorsal wrist ganglion cysts that's what her on the right side. His is been going on since  November. She says that they have been steadily enlarging. She is worn braces that did alleviate the pain but the pain is worse with range of motion of the wrist and use of the hand. She is currently a Consulting civil engineerstudent in IT. He does not radiate. She denies any numbness. Denies any constitutional symptoms.    Review of Systems  Constitutional: Negative.   HENT: Negative.   Eyes: Negative.   Respiratory: Negative.   Cardiovascular: Negative.   Endocrine: Negative.   Musculoskeletal: Negative.   Neurological: Negative.   Hematological: Negative.   Psychiatric/Behavioral: Negative.   All other systems reviewed and are negative.    Objective: Vital Signs: Ht 5\' 1"  (1.549 m)   Wt 136 lb 10.9 oz (62 kg)   LMP 10/13/2016   BMI 25.83 kg/m   Physical Exam  Constitutional: She is oriented to person, place, and time. She appears well-developed and well-nourished.  HENT:  Head: Atraumatic.  Eyes: EOM are normal.  Neck: Neck supple.  Cardiovascular: Intact distal pulses.   Pulmonary/Chest: Effort normal.  Abdominal: Soft.  Neurological: She is alert and oriented to person, place, and time.  Skin: Skin is warm. Capillary refill takes less than 2 seconds.  Psychiatric: She has a normal mood and affect. Her behavior is normal. Judgment and thought content normal.  Nursing  note and vitals reviewed.   Ortho Exam Exam of the right wrist shows a palpable firm ganglion cyst on the dorsum of the right wrist. There is no skin changes or warmth or signs of infection. This does transilluminate with light. Exam of the left wrist shows a very small ganglion cysts also on the dorsum of the wrist. This is much smaller and difficult to assess the true size of this.  I don't sense that this bothers her that much. Specialty Comments:  No specialty comments available.  Imaging: No results found.   PMFS History: Patient Active Problem List   Diagnosis Date Noted  . Vitamin D deficiency 10/17/2016    History reviewed. No pertinent past medical history.  Family History  Problem Relation Age of Onset  . Heart disease Mother     History reviewed. No pertinent surgical history. Social History   Occupational History  . Not on file.   Social History Main Topics  . Smoking status: Never Smoker  . Smokeless tobacco: Never Used  . Alcohol use No  . Drug use: No  . Sexual activity: Not Currently

## 2016-11-01 NOTE — Therapy (Signed)
[facility] [address], [address] [place], [place], [address] Phone: [phone]   Fax:  [phone]  Physical Therapy Treatment  Patient Details  Name: Selena Diaz MRN: [id number] Date of Birth: Sep 19, 1986 Referring Provider: Trena PlattStephanie English, PA  Encounter Date: 11/01/2016      PT End of Session - 11/01/16 1441    Visit Number 10   Number of Visits 12   Date for PT Re-Evaluation 11/21/18   Authorization Type 12 visits, [employer]   PT Start Time 1405  5 min late   PT Stop Time 1451   PT Time Calculation (min) 46 min   Activity Tolerance --      No past medical history on file.  No past surgical history on file.  There were no vitals filed for this visit.      Subjective Assessment - 11/01/16 1406    Subjective I am feeling better since last session.  Dry needling helped.  Current pain right upper trap region.  States she has not done exercises lately.     Currently in Pain? Yes   Pain Score 3    Pain Location Neck   Pain Type Chronic pain                         [facility] Adult PT Treatment/Exercise - 11/01/16 0001      Neck Exercises: Seated   Other Seated Exercise Review of previous HEP and stressed the importance of compliance    Other Seated Exercise left sidebending 20sec hold     Moist Heat Therapy   Number Minutes Moist Heat 10 Minutes   Moist Heat Location Cervical     Manual Therapy   Manual Therapy Taping   Manual therapy comments X K tape across upper and mid back for pain relief and postural support   Joint Mobilization to Rt upper traps, levator scap, infraspinatus rhomoids and low traps   Myofascial Release trigger point release rhomboids   Scapular Mobilization superior/inferior and medial/lateral          Trigger Point Dry Needling - 11/01/16 1440    Upper Trapezius Response Twitch reponse elicited;Palpable increased muscle length   Levator Scapulae Response Twitch  response elicited;Palpable increased muscle length   Rhomboids Response Twitch response elicited;Palpable increased muscle length       Right side only         PT Short Term Goals - 11/01/16 1444      PT SHORT TERM GOAL #1   Title Pt will be independent in her HEP.   Status Achieved     PT SHORT TERM GOAL #2   Title pt will be able to report decreased pain at rest <4/10 in order to improve quality of life and functional mobility.    Status Achieved           PT Long Term Goals - 11/01/16 1445      PT LONG TERM GOAL #1   Title Pt will improve FOTO from 34% limitaion to 24% limitation.   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    Time 6   Period Weeks   Status On-going     PT LONG TERM GOAL #3   Title  Pt will improve her cervical rotation to the right by 20 degrees in order to improve driving safety.    Time 6   Period Weeks   Status On-going     PT LONG TERM GOAL #4   Title Pt will improve her R shoulder abduction by 20 degrees in order to improve functional mobility.    Time 6   Period Weeks   Status On-going               Plan - 11/01/16 1441    Clinical Impression Statement The patient has fewer tender points with the largest on right side only upper traps, levator and rhomboid muscles.  Following dry needling and manual therapy, much improved soft tissue length.  Discussed the importance of home stretching and postural strength for best long term prognosis.  Therapist closely monitoring response will all interventions.     PT Next Visit Plan Dry needling #5,  soft tissue mobilization;  scapular mobilization; assess response to K-taping; continue 1x/week for 2;   postural training      Patient will benefit from skilled therapeutic intervention in order to improve the following  deficits and impairments:     Visit Diagnosis: Cervicalgia  Abnormal posture     Problem List Patient Active Problem List   Diagnosis Date Noted  . Ganglion cyst of dorsum of right wrist 11/01/2016  . Vitamin D deficiency 10/17/2016   Lavinia SharpsStacy Tayley Mudrick, PT 11/01/16 7:10 PM Phone: [phone] Fax: [phone]  Vivien PrestoSimpson, Lum Stillinger C 11/01/2016, 7:09 PM  [facility] [address], [address] [place], [place], [address] Phone: [phone]   Fax:  [phone]  Name: Selena Diaz MRN: [id number] Date of Birth: 1986/07/19

## 2016-11-08 ENCOUNTER — Ambulatory Visit: Payer: 59 | Attending: Physician Assistant | Admitting: Physical Therapy

## 2016-11-08 DIAGNOSIS — M542 Cervicalgia: Secondary | ICD-10-CM | POA: Diagnosis present

## 2016-11-08 DIAGNOSIS — R293 Abnormal posture: Secondary | ICD-10-CM | POA: Diagnosis present

## 2016-11-08 NOTE — Therapy (Signed)
[facility] [address] [place], [place], [address] Phone: [phone]   Fax:  [phone]  Physical Therapy Treatment  Patient Details  Name: Selena Diaz MRN: [id number] Date of Birth: 08/30/1986 Referring Provider: Ivar Drape, PA  Encounter Date: 11/08/2016      PT End of Session - 11/08/16 1744    Visit Number 11   Number of Visits 12   Date for PT Re-Evaluation 11/21/18   Authorization Type 12 visits, [employer]   PT Start Time 1400   PT Stop Time 1450   PT Time Calculation (min) 50 min   Activity Tolerance Patient tolerated treatment well      No past medical history on file.  No past surgical history on file.  There were no vitals filed for this visit.      Subjective Assessment - 11/08/16 1401    Subjective Not so good this time.   I felt good while the tape was on.  I felt for a few days while it was on  When I try to stretch, I feel the pain in my (right) upper trap and interscapular region.   Discussed women's gym options.   Currently in Pain? Yes   Pain Score 6    Pain Location Neck   Pain Orientation Right   Pain Type Chronic pain                         [facility] Adult PT Treatment/Exercise - 11/08/16 0001      Neck Exercises: Seated   Postural Training Discussed gyms for postural strengthening;  patient requests info for women's only gym secondary for religious and cultural reasons   Other Seated Exercise Review of previous HEP and stressed the importance of compliance    Other Seated Exercise left sidebending 20sec hold     Moist Heat Therapy   Number Minutes Moist Heat 10 Minutes   Moist Heat Location Cervical     Manual Therapy   Manual therapy comments McConnell strapping tape for upper trap inhibition and interscapular X for postural correction   Joint Mobilization to Rt upper traps, levator scap, infraspinatus rhomoids and low traps   Myofascial Release trigger  point release rhomboids                  PT Short Term Goals - 11/08/16 1749      PT SHORT TERM GOAL #1   Title Pt will be independent in her HEP.     PT SHORT TERM GOAL #2   Title pt will be able to report decreased pain at rest <4/10 in order to improve quality of life and functional mobility.    Status Achieved           PT Long Term Goals - 11/08/16 1749      PT LONG TERM GOAL #1   Title Pt will improve FOTO from 34% limitaion to 24% limitation.   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    Time 6   Period Weeks   Status On-going     PT LONG TERM GOAL #3   Title Pt will improve her cervical rotation to the right by 20 degrees in order to improve driving  safety.    Time 6   Period Weeks   Status On-going     PT LONG TERM GOAL #4   Title Pt will improve her R shoulder abduction by 20 degrees in order to improve functional mobility.    Time 6   Status On-going               Plan - 11/08/16 1744    Clinical Impression Statement The patient reports a variable response.  She reports feeling better for a few days after PT but then pain returns.  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  Stressed the importance of not just stretching but postural muscle strengthening.  She would like to join a gym but needs a women's only gym for religious and cultural reasons.  Researched this and found a gym that is fairly close to her home that is women only.  Patient to call for more info.  Tender points in upper traps, levators, rhomboids with improved muscle length following session.   Therapist closely monitoring response with all.  Recommend discharge next visit if majority of goals met.     PT Next Visit Plan  check cervical and shoulder ROM and progress toward goals;  FOTO;  ask about [employer] gym;  assess response to McConnell taping      Patient will benefit from skilled therapeutic intervention in order to improve the following deficits and impairments:     Visit Diagnosis: Cervicalgia  Abnormal posture     Problem List Patient Active Problem List   Diagnosis Date Noted  . Ganglion cyst of dorsum of right wrist 11/01/2016  . Vitamin D deficiency 10/17/2016   Ruben Im, PT 11/08/16 5:51 PM Phone: [phone] Fax: [phone]  Alvera Singh 11/08/2016, 5:50 PM  [facility] [address] [place], [place], [address] Phone: [phone]   Fax:  [phone]  Name: Selena Diaz MRN: [id number] Date of Birth: 1986-06-08

## 2016-11-15 ENCOUNTER — Ambulatory Visit: Payer: 59 | Admitting: Physical Therapy

## 2016-11-15 DIAGNOSIS — R293 Abnormal posture: Secondary | ICD-10-CM

## 2016-11-15 DIAGNOSIS — M542 Cervicalgia: Secondary | ICD-10-CM | POA: Diagnosis not present

## 2016-11-15 NOTE — Therapy (Signed)
[facility] [address] [place], [place], [address] Phone: [phone]   Fax:  [phone]  Physical Therapy Treatment  Patient Details  Name: Martina Brodbeck MRN: [id number] Date of Birth: [birth year] Referring Provider: Ivar Drape, PA  Encounter Date: 11/15/2016      PT End of Session - 11/15/16 1453    Visit Number 12   Number of Visits 12   Date for PT Re-Evaluation 11/21/18   Authorization Type 12 visits, [employer]   PT Start Time 1400   PT Stop Time 1500   PT Time Calculation (min) 60 min   Activity Tolerance Patient tolerated treatment well      No past medical history on file.  No past surgical history on file.  There were no vitals filed for this visit.      Subjective Assessment - 11/15/16 1410    Subjective My week has been good but yesterday I had pain in my right (upper trap) shoulder.  Going to start going to going to [employer] and will start Jan 3rd.  Tape stayed on for 24 hours and it was helpful.   Currently in Pain? Yes   Pain Score 4    Pain Location Neck   Pain Orientation Right   Pain Type Chronic pain            [facility] PT Assessment - 11/15/16 0001      Observation/Other Assessments   Focus on Therapeutic Outcomes (FOTO)  29% limitation     ROM / Strength   AROM / PROM / Strength --  periscapular strength 4+/5;  deep cervical flexor 4+     AROM   Right Shoulder Flexion 160 Degrees   Right Shoulder ABduction 165 Degrees   Right Shoulder Internal Rotation 90 Degrees   Right Shoulder External Rotation 90 Degrees   Left Shoulder Flexion 165 Degrees   Left Shoulder ABduction 162 Degrees   Left Shoulder Internal Rotation 90 Degrees   Left Shoulder External Rotation 90 Degrees   Cervical Flexion 45   Cervical Extension 40   Cervical - Right Side Bend 40   Cervical - Left Side Bend 45   Cervical - Right Rotation 50   Cervical - Left Rotation 50                      [facility] Adult PT Treatment/Exercise - 11/15/16 0001      Neck Exercises: Seated   Other Seated Exercise Review of previous HEP and stressed the importance of compliance  and safe self progression of HEP and gym program at [employer]     Manual Therapy   Manual therapy comments McConnell strapping tape for upper trap inhibition and interscapular X for postural correction   Joint Mobilization to Rt upper traps, levator scap, infraspinatus rhomoids and low traps          Trigger Point Dry Needling - 11/15/16 1451    Consent Given? Yes   Muscles Treated Upper Body --  bilateral cervical multifidi   Upper Trapezius Response Twitch reponse elicited;Palpable increased muscle length   Levator Scapulae Response Twitch response elicited;Palpable increased muscle length                PT Short Term Goals - 11/15/16 1456      PT SHORT TERM GOAL #1   Title Pt will be independent in her HEP.   Status Achieved     PT SHORT TERM GOAL #2  Title pt will be able to report decreased pain at rest <4/10 in order to improve quality of life and functional mobility.    Status Achieved           PT Long Term Goals - 11/15/16 1429      PT LONG TERM GOAL #1   Title Pt will improve FOTO from 34% limitaion to 24% limitation.   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    Status Achieved     PT LONG TERM GOAL #3   Title Pt will improve her cervical rotation to the right by 20 degrees in order to improve driving safety.    Status Achieved     PT LONG TERM GOAL #4   Title Pt will improve her R shoulder abduction by 20 degrees in order to improve functional mobility.    Status Achieved               Plan - 11/15/16 1457    Clinical Impression Statement Patient reports an 80% improvement in pain and  function.   Excellent improvement in cervical ROM in all planes and she now has full right shoulder ROM in all planes.   She has met the majority of short term and long term goals.        Patient will benefit from skilled therapeutic intervention in order to improve the following deficits and impairments:     Visit Diagnosis: Cervicalgia  Abnormal posture     Problem List Patient Active Problem List   Diagnosis Date Noted  . Ganglion cyst of dorsum of right wrist 11/01/2016  . Vitamin D deficiency 10/17/2016    Alvera Singh 11/15/2016, 8:36 PM  [facility] [address], [place], [place], [address] Phone: [phone]   Fax:  [phone]  Name: Shelbe Haglund MRN: [id number] Date of Birth: 21-Mar-1986

## 2016-11-15 NOTE — Therapy (Signed)
[facility] [address] [place], [place], [address] Phone: [phone]   Fax:  [phone]  Physical Therapy Treatment/Discharge Summary  Patient Details  Name: Selena Diaz MRN: [id number] Date of Birth: 1986/05/31 Referring Provider: Ivar Drape, PA  Encounter Date: 11/15/2016      PT End of Session - 11/15/16 1453    Visit Number 12   Number of Visits 12   Date for PT Re-Evaluation 11/21/18   Authorization Type 12 visits, [employer]   PT Start Time 1400   PT Stop Time 1500   PT Time Calculation (min) 60 min   Activity Tolerance Patient tolerated treatment well      No past medical history on file.  No past surgical history on file.  There were no vitals filed for this visit.      Subjective Assessment - 11/15/16 1410    Subjective My week has been good but yesterday I had pain in my right (upper trap) shoulder.  Going to start going to going to [employer] and will start Jan 3rd.  Tape stayed on for 24 hours and it was helpful.   Currently in Pain? Yes   Pain Score 4    Pain Location Neck   Pain Orientation Right   Pain Type Chronic pain            [facility] PT Assessment - 11/15/16 0001      Observation/Other Assessments   Focus on Therapeutic Outcomes (FOTO)  29% limitation     ROM / Strength   AROM / PROM / Strength --  periscapular strength 4+/5;  deep cervical flexor 4+     AROM   Right Shoulder Flexion 160 Degrees   Right Shoulder ABduction 165 Degrees   Right Shoulder Internal Rotation 90 Degrees   Right Shoulder External Rotation 90 Degrees   Left Shoulder Flexion 165 Degrees   Left Shoulder ABduction 162 Degrees   Left Shoulder Internal Rotation 90 Degrees   Left Shoulder External Rotation 90 Degrees   Cervical Flexion 45   Cervical Extension 40   Cervical - Right Side Bend 40   Cervical - Left Side Bend 45   Cervical - Right Rotation 50   Cervical - Left Rotation 50                      [facility] Adult PT Treatment/Exercise - 11/15/16 0001      Neck Exercises: Seated   Other Seated Exercise Review of previous HEP and stressed the importance of compliance  and safe self progression of HEP and gym program at [employer]     Manual Therapy   Manual therapy comments McConnell strapping tape for upper trap inhibition and interscapular X for postural correction   Joint Mobilization to Rt upper traps, levator scap, infraspinatus rhomoids and low traps          Trigger Point Dry Needling - 11/15/16 1451    Consent Given? Yes   Muscles Treated Upper Body --  bilateral cervical multifidi   Upper Trapezius Response Twitch reponse elicited;Palpable increased muscle length   Levator Scapulae Response Twitch response elicited;Palpable increased muscle length                PT Short Term Goals - 11/15/16 1456      PT SHORT TERM GOAL #1   Title Pt will be independent in her HEP.   Status Achieved     PT SHORT TERM GOAL #2  Title pt will be able to report decreased pain at rest <4/10 in order to improve quality of life and functional mobility.    Status Achieved           PT Long Term Goals - 11/15/16 1429      PT LONG TERM GOAL #1   Title Pt will improve FOTO from 34% limitaion to 24% limitation.   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    Status Achieved     PT LONG TERM GOAL #3   Title Pt will improve her cervical rotation to the right by 20 degrees in order to improve driving safety.    Status Achieved     PT LONG TERM GOAL #4   Title Pt will improve her R shoulder abduction by 20 degrees in order to improve functional mobility.    Status Achieved               Plan - 11/15/16 1457    Clinical Impression Statement Patient reports an 80% improvement in pain and  function.   Excellent improvement in cervical ROM in all planes and she now has full right shoulder ROM in all planes.   She attributes her improvement to dry needling, manual therapy and postural strengthening.  She has met the majority of short term and long term goals.  She is independent in a HEP and will initiate a gym program at [employer] in January.  Will discharge from PT at this time.        Patient will benefit from skilled therapeutic intervention in order to improve the following deficits and impairments:     Visit Diagnosis: Cervicalgia  Abnormal posture    PHYSICAL THERAPY DISCHARGE SUMMARY  Visits from Start of Care: 12  Current functional level related to goals / functional outcomes: See clinical impressions statement above   Remaining deficits: As above   Education / Equipment: Comprehensive HEP Plan: Patient agrees to discharge.  Patient goals were met. Patient is being discharged due to meeting the stated rehab goals.  ?????        Problem List Patient Active Problem List   Diagnosis Date Noted  . Ganglion cyst of dorsum of right wrist 11/01/2016  . Vitamin D deficiency 10/17/2016   Ruben Im, PT 11/15/16 8:41 PM Phone: [phone] Fax: [phone] Alvera Singh 11/15/2016, 8:40 PM  [facility] [address], [place], [place], [address] Phone: [phone]   Fax:  [phone]  Name: Selena Diaz MRN: [id number] Date of Birth: [birth year]

## 2016-11-27 ENCOUNTER — Other Ambulatory Visit: Payer: Self-pay | Admitting: Physician Assistant

## 2017-04-13 ENCOUNTER — Ambulatory Visit (INDEPENDENT_AMBULATORY_CARE_PROVIDER_SITE_OTHER): Payer: PPO | Admitting: Physician Assistant

## 2017-04-13 ENCOUNTER — Encounter: Payer: Self-pay | Admitting: Physician Assistant

## 2017-04-13 VITALS — BP 98/60 | HR 76 | Temp 98.4°F | Resp 16 | Ht 61.0 in | Wt 137.0 lb

## 2017-04-13 DIAGNOSIS — E559 Vitamin D deficiency, unspecified: Secondary | ICD-10-CM | POA: Diagnosis not present

## 2017-04-13 DIAGNOSIS — R109 Unspecified abdominal pain: Secondary | ICD-10-CM

## 2017-04-13 MED ORDER — OMEPRAZOLE 20 MG PO CPDR: 20.0000 mg | DELAYED_RELEASE_CAPSULE | Freq: Every day | ORAL | 1 refills | Status: DC

## 2017-04-13 NOTE — Patient Instructions (Addendum)
Please return in 2 weeks if your symptoms are not improved.    Food Choices for Gastroesophageal Reflux Disease, Adult When you have gastroesophageal reflux disease (GERD), the foods you eat and your eating habits are very important. Choosing the right foods can help ease your discomfort. What guidelines do I need to follow?  Choose fruits, vegetables, whole grains, and low-fat dairy products.  Choose low-fat meat, fish, and poultry.  Limit fats such as oils, salad dressings, butter, nuts, and avocado.  Keep a food diary. This helps you identify foods that cause symptoms.  Avoid foods that cause symptoms. These may be different for everyone.  Eat small meals often instead of 3 large meals a day.  Eat your meals slowly, in a place where you are relaxed.  Limit fried foods.  Cook foods using methods other than frying.  Avoid drinking alcohol.  Avoid drinking large amounts of liquids with your meals.  Avoid bending over or lying down until 2-3 hours after eating. What foods are not recommended? These are some foods and drinks that may make your symptoms worse: Vegetables  Tomatoes. Tomato juice. Tomato and spaghetti sauce. Chili peppers. Onion and garlic. Horseradish. Fruits  Oranges, grapefruit, and lemon (fruit and juice). Meats  High-fat meats, fish, and poultry. This includes hot dogs, ribs, ham, sausage, salami, and bacon. Dairy  Whole milk and chocolate milk. Sour cream. Cream. Butter. Ice cream. Cream cheese. Drinks  Coffee and tea. Bubbly (carbonated) drinks or energy drinks. Condiments  Hot sauce. Barbecue sauce. Sweets/Desserts  Chocolate and cocoa. Donuts. Peppermint and spearmint. Fats and Oils  High-fat foods. This includes Jamaica[place] fries and potato chips. Other  Vinegar. Strong spices. This includes black pepper, white pepper, red pepper, cayenne, curry powder, cloves, ginger, and chili powder. The items listed above may not be a complete list of foods and  drinks to avoid. Contact your dietitian for more information.  This information is not intended to replace advice given to you by your health care provider. Make sure you discuss any questions you have with your health care provider. Document Released: 05/22/2012 Document Revised: 04/28/2016 Document Reviewed: 09/25/2013 Elsevier Interactive Patient Education  2017 [employer]   IF you received an x-ray today, you will receive an invoice from [facility]. Please contact [facility] at [phone] with questions or concerns regarding your invoice.   IF you received labwork today, you will receive an invoice from [facility]. Please contact [facility] at [phone] with questions or concerns regarding your invoice.   Our billing staff will not be able to assist you with questions regarding bills from these companies.  You will be contacted with the lab results as soon as they are available. The fastest way to get your results is to activate your My Chart account. Instructions are located on the last page of this paperwork. If you have not heard from us regarding the results in 2 weeks, please contact this office.

## 2017-04-13 NOTE — Progress Notes (Signed)
[facility] [address], [place] [id number] [phone]  Date:  04/13/2017   Name:  Selena DeedsReem Diaz   DOB:  Feb 08, 1986   MRN:  [id number]  PCP:  Patient, No Pcp Per    History of Present Illness:  Selena Diaz is a 31 y.o. female patient who presents to PCP with  Chief Complaint  Patient presents with  . Leg Pain    x one month ago    Patient reports 1 month of pain at her left hip.  Palpable pain at the hip.  She has no hx of trauma.  She notices no swelling.  She is able to ambulate and walk without difficulty.  No instability.  No radiating pain.  She has no joint pain anywhere else She reports that she also has stomach pain.  She describes it as a weight associated with following a meal.  She is not able to ambulate or do any activity after eating.  The way to relieve the symptom is to force a burp.  Wt Readings from Last 3 Encounters:  04/13/17 137 lb (62.1 kg)  11/01/16 136 lb 10.9 oz (62 kg)  10/17/16 139 lb 9.6 oz (63.3 kg)     Patient Active Problem List   Diagnosis Date Noted  . Ganglion cyst of dorsum of right wrist 11/01/2016  . Vitamin D deficiency 10/17/2016    History reviewed. No pertinent past medical history.  History reviewed. No pertinent surgical history.  Social History  Substance Use Topics  . Smoking status: Never Smoker  . Smokeless tobacco: Never Used  . Alcohol use No    Family History  Problem Relation Age of Onset  . Heart disease Mother     No Known Allergies  Medication list has been reviewed and updated.  Current Outpatient Prescriptions on File Prior to Visit  Medication Sig Dispense Refill  . Vitamin D, Ergocalciferol, (DRISDOL) 50000 units CAPS capsule TAKE ONE CAPSULE BY MOUTH ONCE A WEEK (Patient not taking: Reported on 04/13/2017) 12 capsule 0   No current facility-administered medications on file prior to visit.     ROS ROS otherwise unremarkable unless listed above.  Physical Examination: BP 98/60  (BP Location: Right Arm, Patient Position: Sitting, Cuff Size: Small)   Pulse 76   Temp 98.4 F (36.9 C) (Oral)   Resp 16   Ht 5\' 1"  (1.549 m)   Wt 137 lb (62.1 kg)   LMP 03/31/2017   SpO2 96%   BMI 25.89 kg/m  Ideal Body Weight: Weight in (lb) to have BMI = 25: 132  Physical Exam  Constitutional: She is oriented to person, place, and time. She appears well-developed and well-nourished. No distress.  HENT:  Head: Normocephalic and atraumatic.  Right Ear: External ear normal.  Left Ear: External ear normal.  Eyes: Conjunctivae and EOM are normal. Pupils are equal, round, and reactive to light.  Cardiovascular: Normal rate.   Pulmonary/Chest: Effort normal. No respiratory distress.  Musculoskeletal:       Left hip: She exhibits bony tenderness (tender along at the iliac crest more superior). She exhibits normal range of motion, normal strength and no swelling.  Neurological: She is alert and oriented to person, place, and time.  Skin: She is not diaphoretic.  Psychiatric: She has a normal mood and affect. Her behavior is normal.     Assessment and Plan: Selena Diaz is a 31 y.o. female who is here today for cc of abdominal symptoms, and having left  hip pain. Advised Voltaren gel, as we will insure that the GI complaint is not due to GERD.  Attempting to watch anti=inflammatory.  Advised ice at the hip as well for 15 minutes three times per day.  rtc in 2 weeks if no improvement.   Given ppi and anti-reflux diet.  Voiced understanding. Stomach pain - Plan: omeprazole (PRILOSEC) 20 MG capsule  Vitamin D deficiency - Plan: VITAMIN D 25 Hydroxy (Vit-D Deficiency, Fractures)  Trena Platt, PA-C [facility] 5/12/20187:56 AM

## 2017-04-14 ENCOUNTER — Telehealth: Payer: Self-pay | Admitting: Physician Assistant

## 2017-04-14 LAB — VITAMIN D 25 HYDROXY (VIT D DEFICIENCY, FRACTURES): Vit D, 25-Hydroxy: 38 ng/mL (ref 30.0–100.0)

## 2017-04-14 MED ORDER — DICLOFENAC SODIUM 1 % TD GEL: 4.0000 g | Freq: Four times a day (QID) | TRANSDERMAL | 0 refills | Status: DC

## 2017-04-14 NOTE — Telephone Encounter (Signed)
Sending topical as we are currently treating gerd.

## 2017-04-14 NOTE — Telephone Encounter (Signed)
Pt says she was supposed to have pain med called in for her leg too. [id number]

## 2017-04-14 NOTE — Telephone Encounter (Signed)
Did you talk about pain meds for leg?

## 2017-04-17 NOTE — Telephone Encounter (Signed)
l/m with providers message

## 2017-05-07 ENCOUNTER — Other Ambulatory Visit: Payer: Self-pay | Admitting: Physician Assistant

## 2017-05-08 NOTE — Telephone Encounter (Signed)
04/14/17 last refill and ov

## 2017-07-31 ENCOUNTER — Encounter: Payer: Self-pay | Admitting: Physician Assistant

## 2017-07-31 ENCOUNTER — Ambulatory Visit (INDEPENDENT_AMBULATORY_CARE_PROVIDER_SITE_OTHER): Payer: PPO | Admitting: Physician Assistant

## 2017-07-31 VITALS — BP 119/68 | HR 78 | Temp 98.0°F | Resp 16 | Ht 61.0 in | Wt 135.2 lb

## 2017-07-31 DIAGNOSIS — Z8639 Personal history of other endocrine, nutritional and metabolic disease: Secondary | ICD-10-CM

## 2017-07-31 DIAGNOSIS — R8290 Unspecified abnormal findings in urine: Secondary | ICD-10-CM | POA: Diagnosis not present

## 2017-07-31 LAB — POCT URINALYSIS DIP (MANUAL ENTRY)
BILIRUBIN UA: NEGATIVE
Glucose, UA: NEGATIVE mg/dL
Nitrite, UA: NEGATIVE
Protein Ur, POC: NEGATIVE mg/dL
Urobilinogen, UA: 0.2 E.U./dL
pH, UA: 5.5 (ref 5.0–8.0)

## 2017-07-31 NOTE — Patient Instructions (Signed)
     IF you received an x-ray today, you will receive an invoice from [facility]. Please contact [facility] at [phone] with questions or concerns regarding your invoice.   IF you received labwork today, you will receive an invoice from [facility]. Please contact [facility] at [phone] with questions or concerns regarding your invoice.   Our billing staff will not be able to assist you with questions regarding bills from these companies.  You will be contacted with the lab results as soon as they are available. The fastest way to get your results is to activate your My Chart account. Instructions are located on the last page of this paperwork. If you have not heard from us regarding the results in 2 weeks, please contact this office.     

## 2017-07-31 NOTE — Progress Notes (Signed)
PRIMARY CARE AT [facility] [address] [place] [address] [phone]  Date:  07/31/2017   Name:  Selena Diaz   DOB:  Oct 13, 1986   MRN:  [id number]  PCP:  Patient, No Pcp Per    History of Present Illness:  Selena Diaz is a 31 y.o. female patient who presents to PCP with  Chief Complaint  Patient presents with  . feel heartbeat in upper abdomen    x 2 mos     She states that she can feel her heart beat pulse in her abdomen.  It aggravates her when she pays attention to it, generally when sitting.  She can see it outside of her t-shirt.  She feels she can not sleep due to this movement.   No family hx of aneurysm, or aortic dissection.  There is no pain to this area.  Does not hurt to touch the location She is also concerned that she has urine that is warm.  It is not painful.  Occurs intermittently.  No hematuria, or abdominal pain.  No frequency.  She is not sexually active.   Wt Readings from Last 3 Encounters:  07/31/17 135 lb 3.2 oz (61.3 kg)  04/13/17 137 lb (62.1 kg)  11/01/16 136 lb 10.9 oz (62 kg)      Patient Active Problem List   Diagnosis Date Noted  . Ganglion cyst of dorsum of right wrist 11/01/2016  . Vitamin D deficiency 10/17/2016    No past medical history on file.  No past surgical history on file.  Social History  Substance Use Topics  . Smoking status: Never Smoker  . Smokeless tobacco: Never Used  . Alcohol use No    Family History  Problem Relation Age of Onset  . Heart disease Mother     No Known Allergies  Medication list has been reviewed and updated.  Current Outpatient Prescriptions on File Prior to Visit  Medication Sig Dispense Refill  . omeprazole (PRILOSEC) 20 MG capsule Take 1 capsule (20 mg total) by mouth daily. (Patient not taking: Reported on 07/31/2017) 30 capsule 1  . Vitamin D, Ergocalciferol, (DRISDOL) 50000 units CAPS capsule TAKE ONE CAPSULE BY MOUTH ONCE A WEEK (Patient not taking: Reported on 04/13/2017) 12  capsule 0  . VOLTAREN 1 % GEL APPLY 4 GRAMS TOPICALLY 4 (FOUR) TIMES DAILY. (Patient not taking: Reported on 07/31/2017) 100 g 0   No current facility-administered medications on file prior to visit.     ROS ROS otherwise unremarkable unless listed above.  Physical Examination: BP 119/68   Pulse 78   Temp 98 F (36.7 C) (Oral)   Resp 16   Ht 5\' 1"  (1.549 m)   Wt 135 lb 3.2 oz (61.3 kg)   LMP 07/19/2017   SpO2 98%   BMI 25.55 kg/m  Ideal Body Weight: Weight in (lb) to have BMI = 25: 132  Physical Exam  Constitutional: She is oriented to person, place, and time. She appears well-developed and well-nourished. No distress.  HENT:  Head: Normocephalic and atraumatic.  Right Ear: External ear normal.  Left Ear: External ear normal.  Eyes: Pupils are equal, round, and reactive to light. Conjunctivae and EOM are normal.  Cardiovascular: Normal rate.   Pulmonary/Chest: Effort normal. No respiratory distress.  Neurological: She is alert and oriented to person, place, and time.  Skin: She is not diaphoretic.  Psychiatric: She has a normal mood and affect. Her behavior is normal.      Assessment and  Plan: Selena Diaz is a 31 y.o. female who is here today for cc of pulsating abdomen This is consistent with aorta, and does not appear enlarged.  Likely seen due to her size Vitamin d rechecked. Advised to return anytime for full labs with a physical exam  History of vitamin D deficiency - Plan: VITAMIN D 25 Hydroxy (Vit-D Deficiency, Fractures)  Urine abnormality - Plan: POCT urinalysis dipstick, Urine Culture  Trena Platt, PA-C [facility] 9/2/20187:31 AM

## 2017-08-01 LAB — VITAMIN D 25 HYDROXY (VIT D DEFICIENCY, FRACTURES): Vit D, 25-Hydroxy: 18.1 ng/mL — ABNORMAL LOW (ref 30.0–100.0)

## 2017-08-01 LAB — URINE CULTURE: Organism ID, Bacteria: NO GROWTH

## 2017-08-09 ENCOUNTER — Telehealth: Payer: Self-pay | Admitting: Physician Assistant

## 2017-08-09 MED ORDER — VITAMIN D (ERGOCALCIFEROL) 1.25 MG (50000 UNIT) PO CAPS: 50000.0000 [IU] | ORAL_CAPSULE | ORAL | 0 refills | Status: DC

## 2017-08-09 NOTE — Telephone Encounter (Signed)
Patient has low vitamin d.  Urine was normal Please start the vitamin d weekly and we can recheck this in 2-3 months.

## 2017-08-09 NOTE — Telephone Encounter (Signed)
PATIENT WAS IN THE OFFICE ABOUT A WEEK AGO AND STATES SHE SAW STEPHANIE ENGLISH. SHE IS REQUESTING TO GET THE RESULTS OF HER URINE AND BLOOD TEST PLEASE. BEST PHONE [phone] (CELL) PHARMACY CHOICE IS [facility] ON [address]. MBC

## 2017-08-09 NOTE — Telephone Encounter (Signed)
Spoke with patient and gave them lab results. Advised that her vitamin d levels was low and that she should take vit d supplement weekly and to come back in 2-3 months for a recheck. Patient verbalized understanding.

## 2017-08-09 NOTE — Telephone Encounter (Signed)
Can you please release labs ? No growth on culture but Vitamin D is abnormal

## 2017-09-08 ENCOUNTER — Ambulatory Visit: Payer: PPO | Admitting: Urgent Care

## 2017-09-18 ENCOUNTER — Encounter: Payer: Self-pay | Admitting: Family Medicine

## 2017-09-18 ENCOUNTER — Ambulatory Visit (INDEPENDENT_AMBULATORY_CARE_PROVIDER_SITE_OTHER): Payer: PPO

## 2017-09-18 ENCOUNTER — Ambulatory Visit (INDEPENDENT_AMBULATORY_CARE_PROVIDER_SITE_OTHER): Payer: PPO | Admitting: Family Medicine

## 2017-09-18 VITALS — BP 110/62 | HR 91 | Temp 98.0°F | Resp 16 | Ht 62.21 in | Wt 135.0 lb

## 2017-09-18 DIAGNOSIS — M899 Disorder of bone, unspecified: Secondary | ICD-10-CM | POA: Diagnosis not present

## 2017-09-18 DIAGNOSIS — Z23 Encounter for immunization: Secondary | ICD-10-CM | POA: Diagnosis not present

## 2017-09-18 DIAGNOSIS — M25552 Pain in left hip: Secondary | ICD-10-CM

## 2017-09-18 DIAGNOSIS — M25511 Pain in right shoulder: Secondary | ICD-10-CM | POA: Diagnosis not present

## 2017-09-18 DIAGNOSIS — S73102D Unspecified sprain of left hip, subsequent encounter: Secondary | ICD-10-CM

## 2017-09-18 MED ORDER — MELOXICAM 7.5 MG PO TABS
7.5000 mg | ORAL_TABLET | Freq: Every day | ORAL | 0 refills | Status: DC
Start: 2017-09-18 — End: 2021-06-18

## 2017-09-18 NOTE — Patient Instructions (Addendum)
   IF you received an x-ray today, you will receive an invoice from [facility]. Please contact [facility] at [phone] with questions or concerns regarding your invoice.   IF you received labwork today, you will receive an invoice from [facility]. Please contact [facility] at [phone] with questions or concerns regarding your invoice.   Our billing staff will not be able to assist you with questions regarding bills from these companies.  You will be contacted with the lab results as soon as they are available. The fastest way to get your results is to activate your My Chart account. Instructions are located on the last page of this paperwork. If you have not heard from us regarding the results in 2 weeks, please contact this office.      Hip Exercises Ask your health care provider which exercises are safe for you. Do exercises exactly as told by your health care provider and adjust them as directed. It is normal to feel mild stretching, pulling, tightness, or discomfort as you do these exercises, but you should stop right away if you feel sudden pain or your pain gets worse.Do not begin these exercises until told by your health care provider. STRETCHING AND RANGE OF MOTION EXERCISES These exercises warm up your muscles and joints and improve the movement and flexibility of your hip. These exercises also help to relieve pain, numbness, and tingling. Exercise A: Hamstrings, Supine  1. Lie on your back. 2. Loop a belt or towel over the ball of your left / rightfoot. The ball of your foot is on the walking surface, right under your toes. 3. Straighten your left / rightknee and slowly pull on the belt to raise your leg. ? Do not let your left / right knee bend while you do this. ? Keep your other leg flat on the floor. ? Raise the left / right leg until you feel a gentle stretch behind your left / right knee or thigh. 4. Hold this position for __________  seconds. 5. Slowly return your leg to the starting position. Repeat __________ times. Complete this stretch __________ times a day. Exercise B: Hip Rotators  1. Lie on your back on a firm surface. 2. Hold your left / right knee with your left / right hand. Hold your ankle with your other hand. 3. Gently pull your left / right knee and rotate your lower leg toward your other shoulder. ? Pull until you feel a stretch in your buttocks. ? Keep your hips and shoulders firmly planted while you do this stretch. 4. Hold this position for __________ seconds. Repeat __________ times. Complete this stretch __________ times a day. Exercise C: V-Sit (Hamstrings and Adductors)  1. Sit on the floor with your legs extended in a large "V" shape. Keep your knees straight during this exercise. 2. Start with your head and chest upright, then bend at your waist to reach for your left foot (position A). You should feel a stretch in your right inner thigh. 3. Hold this position for __________ seconds. Then slowly return to the upright position. 4. Bend at your waist to reach forward (position B). You should feel a stretch behind both of your thighs and knees. 5. Hold this position for __________ seconds. Then slowly return to the upright position. 6. Bend at your waist to reach for your right foot (position C). You should feel a stretch in your left inner thigh. 7. Hold this position for __________ seconds. Then slowly return to the   upright position. Repeat __________ times. Complete this stretch __________ times a day. Exercise D: Lunge (Hip Flexors)  1. Place your left / right knee on the floor and bend your other knee so that is directly over your ankle. You should be half-kneeling. 2. Keep good posture with your head over your shoulders. 3. Tighten your buttocks to point your tailbone downward. This helps your back to keep from arching too much. 4. You should feel a gentle stretch in the front of your left /  right thigh and hip. If you do not feel any resistance, slightly slide your other foot forward and then slowly lunge forward so your knee once again lines up over your ankle. 5. Make sure your tailbone continues to point downward. 6. Hold this position for __________ seconds. Repeat __________ times. Complete this stretch __________ times a day. STRENGTHENING EXERCISES These exercises build strength and endurance in your hip. Endurance is the ability to use your muscles for a long time, even after they get tired. Exercise E: Bridge (Hip Extensors)  1. Lie on your back on a firm surface with your knees bent and your feet flat on the floor. 2. Tighten your buttocks muscles and lift your bottom off the floor until the trunk of your body is level with your thighs. ? Do not arch your back. ? You should feel the muscles working in your buttocks and the back of your thighs. If you do not feel these muscles, slide your feet 1-2 inches (2.5-5 cm) farther away from your buttocks. 3. Hold this position for __________ seconds. 4. Slowly lower your hips to the starting position. 5. Let your muscles relax completely between repetitions. 6. If this exercise is too easy, try doing it with your arms crossed over your chest. Repeat __________ times. Complete this exercise __________ times a day. Exercise F: Straight Leg Raises - Hip Abductors  1. Lie on your side with your left / right leg in the top position. Lie so your head, shoulder, knee, and hip line up with each other. You may bend your bottom knee to help you balance. 2. Roll your hips slightly forward, so your hips are stacked directly over each other and your left / right knee is facing forward. 3. Leading with your heel, lift your top leg 4-6 inches (10-15 cm). You should feel the muscles in your outer hip lifting. ? Do not let your foot drift forward. ? Do not let your knee roll toward the ceiling. 4. Hold this position for __________  seconds. 5. Slowly return to the starting position. 6. Let your muscles relax completely between repetitions. Repeat __________ times. Complete this exercise __________ times a day. Exercise G: Straight Leg Raises - Hip Adductors  1. Lie on your side with your left / right leg in the bottom position. Lie so your head, shoulder, knee, and hip line up. You may place your upper foot in front to help you balance. 2. Roll your hips slightly forward, so your hips are stacked directly over each other and your left / right knee is facing forward. 3. Tense the muscles in your inner thigh and lift your bottom leg 4-6 inches (10-15 cm). 4. Hold this position for __________ seconds. 5. Slowly return to the starting position. 6. Let your muscles relax completely between repetitions. Repeat __________ times. Complete this exercise __________ times a day. Exercise H: Straight Leg Raises - Quadriceps  1. Lie on your back with your left / right leg extended and   your other knee bent. 2. Tense the muscles in the front of your left / right thigh. When you do this, you should see your kneecap slide up or see increased dimpling just above your knee. 3. Tighten these muscles even more and raise your leg 4-6 inches (10-15 cm) off the floor. 4. Hold this position for __________ seconds. 5. Keep these muscles tense as you lower your leg. 6. Relax the muscles slowly and completely between repetitions. Repeat __________ times. Complete this exercise __________ times a day. Exercise I: Hip Abductors, Standing 1. Tie one end of a rubber exercise band or tubing to a secure surface, such as a table or pole. 2. Loop the other end of the band or tubing around your left / right ankle. 3. Keeping your ankle with the band or tubing directly opposite of the secured end, step away until there is tension in the tubing or band. Hold onto a chair as needed for balance. 4. Lift your left / right leg out to your side. While you do  this: ? Keep your back upright. ? Keep your shoulders over your hips. ? Keep your toes pointing forward. ? Make sure to use your hip muscles to lift your leg. Do not "throw" your leg or tip your body to lift your leg. 5. Hold this position for __________ seconds. 6. Slowly return to the starting position. Repeat __________ times. Complete this exercise __________ times a day. Exercise J: Squats (Quadriceps) 1. Stand in a door frame so your feet and knees are in line with the frame. You may place your hands on the frame for balance. 2. Slowly bend your knees and lower your hips like you are going to sit in a chair. ? Keep your lower legs in a straight-up-and-down position. ? Do not let your hips go lower than your knees. ? Do not bend your knees lower than told by your health care provider. ? If your hip pain increases, do not bend as low. 3. Hold this position for ___________ seconds. 4. Slowly push with your legs to return to standing. Do not use your hands to pull yourself to standing. Repeat __________ times. Complete this exercise __________ times a day. This information is not intended to replace advice given to you by your health care provider. Make sure you discuss any questions you have with your health care provider. Document Released: 12/09/2005 Document Revised: 08/15/2016 Document Reviewed: 11/16/2015 [employer] Interactive Patient Education  2018 [employer]  

## 2017-09-18 NOTE — Progress Notes (Signed)
Subjective:    Patient ID: Selena Diaz, female    DOB: [birth year], 31 y.o.   MRN: [id number]  09/18/2017  Leg Pain (muscle pain in the left leg x 6months pt states it starts in the hip and goes down the leg. )    HPI This 31 y.o. female presents for evaluation of LEFT leg pain.  Onset six months ago. Has tried to ignore, apply cream, and stretches.  Was sitting on bed and stood up quickly with acute onset of pain in LEFT hip/pelvis; pain with palpation tender anterior hip and has internal pain.  No lower back pain.  Pain only at L iliac crest.  If prolonged sitting with driving, hard to get up and ambulate due to stiffness.  When rolling over in bed, has pain.  Student at [employer].   No limping.  No medication by mouth.  Applied Voltaren gel.  When does exercise at the gym, pain worsens.  Stationary bike.  R shoulder pain: duration eight years; s/p physical therapy with dry needling with improvement but now recurrent.  BP Readings from Last 3 Encounters:  09/18/17 110/62  07/31/17 119/68  04/13/17 98/60   Wt Readings from Last 3 Encounters:  09/18/17 135 lb (61.2 kg)  07/31/17 135 lb 3.2 oz (61.3 kg)  04/13/17 137 lb (62.1 kg)   Immunization History  Administered Date(s) Administered  . Influenza,inj,Quad PF,6+ Mos 12/08/2015, 09/15/2016, 09/18/2017    Review of Systems  Constitutional: Negative for chills, diaphoresis, fatigue and fever.  HENT: Negative for ear pain, postnasal drip, rhinorrhea, sinus pressure, sore throat and trouble swallowing.   Respiratory: Negative for cough and shortness of breath.   Cardiovascular: Negative for chest pain, palpitations and leg swelling.  Gastrointestinal: Negative for abdominal pain, constipation, diarrhea, nausea and vomiting.  Musculoskeletal: Positive for arthralgias, myalgias, neck pain and neck stiffness. Negative for back pain, gait problem and joint swelling.    History reviewed. No pertinent past medical history. History  reviewed. No pertinent surgical history. No Known Allergies Current Outpatient Prescriptions on File Prior to Visit  Medication Sig Dispense Refill  . omeprazole (PRILOSEC) 20 MG capsule Take 1 capsule (20 mg total) by mouth daily. 30 capsule 1  . Vitamin D, Ergocalciferol, (DRISDOL) 50000 units CAPS capsule Take 1 capsule (50,000 Units total) by mouth once a week. 12 capsule 0  . VOLTAREN 1 % GEL APPLY 4 GRAMS TOPICALLY 4 (FOUR) TIMES DAILY. 100 g 0   No current facility-administered medications on file prior to visit.    Social History   Social History  . Marital status: Single    Spouse name: N/A  . Number of children: N/A  . Years of education: N/A   Occupational History  . Not on file.   Social History Main Topics  . Smoking status: Never Smoker  . Smokeless tobacco: Never Used  . Alcohol use No  . Drug use: No  . Sexual activity: Not Currently   Other Topics Concern  . Not on file   Social History Narrative  . No narrative on file   Family History  Problem Relation Age of Onset  . Heart disease Mother        Objective:    BP 110/62   Pulse 91   Temp 98 F (36.7 C) (Oral)   Resp 16   Ht 5' 2.21" (1.58 m)   Wt 135 lb (61.2 kg)   LMP 09/10/2017 (Approximate)   SpO2 95%   BMI 24.53 kg/m  Physical Exam  Constitutional: She is oriented to person, place, and time. She appears well-developed and well-nourished. No distress.  HENT:  Head: Normocephalic and atraumatic.  Eyes: Pupils are equal, round, and reactive to light. Conjunctivae are normal.  Neck: Normal range of motion. Neck supple.  Cardiovascular: Normal rate, regular rhythm and normal heart sounds.  Exam reveals no gallop and no friction rub.   No murmur heard. Pulmonary/Chest: Effort normal and breath sounds normal. She has no wheezes. She has no rales.  Musculoskeletal:       Right shoulder: Normal. She exhibits normal range of motion, no tenderness and no bony tenderness.       Left hip: She  exhibits tenderness. She exhibits normal range of motion, normal strength, no bony tenderness, no swelling and no crepitus.       Cervical back: She exhibits tenderness, pain and spasm. She exhibits normal range of motion and no bony tenderness.       Lumbar back: Normal. She exhibits normal range of motion, no tenderness and no bony tenderness.       Back:       Legs: +TTP L trapezius and scapular region.  Neurological: She is alert and oriented to person, place, and time.  Skin: She is not diaphoretic.  Psychiatric: She has a normal mood and affect. Her behavior is normal.  Nursing note and vitals reviewed.  No results found. Depression screen [facility] 2/9 07/31/2017 04/13/2017 10/17/2016 09/15/2016 12/08/2015  Decreased Interest 0 0 0 0 0  Down, Depressed, Hopeless 0 0 0 0 0  PHQ - 2 Score 0 0 0 0 0   Fall Risk  07/31/2017 04/13/2017  Falls in the past year? No No        Assessment & Plan:   1. Hip pain, left   2. Sprain of left hip, subsequent encounter   3. Pain in joint of right shoulder   4. Scapular dysfunction   5. Need for prophylactic vaccination and inoculation against influenza     -Persistent LEFT hip anterior pain after standing up abruptly; obtain L hip film; consistent with sprain; rx for Meloxicam; due to duration, refer to physical therapy. -persistent RIGHT scapular sprain; refer to physical therapy; rx for Meloxicam. -if no improvement with six weeks of physical therapy, refer to ortho   Orders Placed This Encounter  Procedures  . DG HIP UNILAT W OR W/O PELVIS 2-3 VIEWS LEFT    Standing Status:   Future    Number of Occurrences:   1    Standing Expiration Date:   09/18/2018    Order Specific Question:   Reason for Exam (SYMPTOM  OR DIAGNOSIS REQUIRED)    Answer:   L hip pain with palpation at superior aspect of hip; no limping; no trauma; acute onset of pain with standing up quickly.    Order Specific Question:   Is the patient pregnant?    Answer:   No     Order Specific Question:   Preferred imaging location?    Answer:   External  . DG Cervical Spine Complete    Standing Status:   Future    Number of Occurrences:   1    Standing Expiration Date:   09/18/2018    Order Specific Question:   Reason for Exam (SYMPTOM  OR DIAGNOSIS REQUIRED)    Answer:   R scapular and scaphoid pain for eight years.    Order Specific Question:   Is the patient pregnant?  Answer:   No    Order Specific Question:   Preferred imaging location?    Answer:   External  . Flu Vaccine QUAD 36+ mos IM  . Ambulatory referral to Physical Therapy    Referral Priority:   Routine    Referral Type:   Physical Medicine    Referral Reason:   Specialty Services Required    Requested Specialty:   Physical Therapy    Number of Visits Requested:   1   Meds ordered this encounter  Medications  . meloxicam (MOBIC) 7.5 MG tablet    Sig: Take 1 tablet (7.5 mg total) by mouth daily.    Dispense:  30 tablet    Refill:  0    No Follow-up on file.   Wahneta Derocher Paulita Fujita, M.D. Primary Care at [facility] previously [facility] [address] [place], [place]  [address] [phone] phone [phone] fax

## 2017-10-04 ENCOUNTER — Encounter: Payer: Self-pay | Admitting: Physical Therapy

## 2017-10-04 ENCOUNTER — Ambulatory Visit: Payer: PPO | Attending: Family Medicine | Admitting: Physical Therapy

## 2017-10-04 DIAGNOSIS — M62838 Other muscle spasm: Secondary | ICD-10-CM | POA: Insufficient documentation

## 2017-10-04 DIAGNOSIS — R293 Abnormal posture: Secondary | ICD-10-CM | POA: Diagnosis present

## 2017-10-04 DIAGNOSIS — M6281 Muscle weakness (generalized): Secondary | ICD-10-CM | POA: Diagnosis present

## 2017-10-04 NOTE — Therapy (Signed)
[facility] [address] [place], [place], [address] Phone: [phone]   Fax:  [phone]  Physical Therapy Evaluation  Patient Details  Name: Selena Diaz MRN: [id number] Date of Birth: 11-Aug-1986 Referring Provider: Wardell Honour, MD  Encounter Date: 10/04/2017      PT End of Session - 10/04/17 1354    Visit Number 1   Date for PT Re-Evaluation 11/29/17   PT Start Time 4259   PT Stop Time 1314   PT Time Calculation (min) 41 min   Activity Tolerance Patient tolerated treatment well   Behavior During Therapy [facility] for tasks assessed/performed      History reviewed. No pertinent past medical history.  History reviewed. No pertinent surgical history.  There were no vitals filed for this visit.       Subjective Assessment - 10/04/17 1243    Subjective Pt reports having hip pain that feels deep but also tender to touch.  She states she has difficulty moving in bed or sitting long time has some difficulty walking for the first few minutes after getting   Limitations Sitting   How long can you sit comfortably? 30 minutes   Patient Stated Goals get rid of the pain   Currently in Pain? Yes   Pain Score 7   down to a 3/10   Pain Location Hip   Pain Orientation Left   Pain Descriptors / Indicators Sharp   Pain Type Chronic pain   Pain Onset More than a month ago   Pain Frequency Intermittent   Aggravating Factors  sitting long time   Pain Relieving Factors nothing   Effect of Pain on Daily Activities sitting   Multiple Pain Sites Yes   Pain Score 6   Pain Location Shoulder   Pain Orientation Right   Pain Descriptors / Indicators Tightness   Pain Type Chronic pain   Pain Radiating Towards around the scapula   Pain Onset More than a month ago   Pain Frequency Intermittent   Aggravating Factors  cold weather   Pain Relieving Factors cupping and massage   Effect of Pain on Daily Activities sometimes  cannot wash dishes and driving for a long time            [facility] PT Assessment - 10/04/17 0001      Assessment   Medical Diagnosis M25.552 (ICD-10-CM) - Hip pain, left; M25.511 (ICD-10-CM) - Pain in joint of right shoulder; M89.9 (ICD-10-CM) - Scapular dysfunction   Referring Provider Wardell Honour, MD   Onset Date/Surgical Date --  6 months   Prior Therapy yes for shoulder, no for hip     Precautions   Precautions None     Restrictions   Weight Bearing Restrictions No     Balance Screen   Has the patient fallen in the past 6 months No     [place] residence   Living Arrangements Other relatives  brother     Prior Function   Level of Independence Independent   Vocation Student   Leisure going to the gym     Cognition   Overall Cognitive Status Within Functional Limits for tasks assessed     Observation/Other Assessments   Focus on Therapeutic Outcomes (FOTO)  22% limted     Posture/Postural Control   Posture/Postural Control Postural limitations   Postural Limitations Rounded Shoulders;Left pelvic obliquity   Posture Comments forward flexion demonstrates hypermobility of left SI  joint     ROM / Strength   AROM / PROM / Strength AROM;Strength     AROM   Overall AROM Comments lumbar AROM WNL     Strength   Strength Assessment Site Hip   Right/Left Hip Right;Left   Right Hip Extension 5/5   Right Hip ABduction 4+/5   Right Hip ADduction 4+/5   Left Hip Extension 4-/5   Left Hip ABduction 4-/5   Left Hip ADduction 4-/5     Palpation   SI assessment  left anterior rotation, left hypermobility   Palpation comment left glute med and max tender and tight, TFL, lumbar paraspinals tight, tender to palpation at SI joint     Ambulation/Gait   Gait Pattern Within Functional Limits            Objective measurements completed on examination: See above findings.          [facility] Adult PT Treatment/Exercise -  10/04/17 0001      Manual Therapy   Manual Therapy Muscle Energy Technique   Manual therapy comments correcting left anterior rotation                  PT Short Term Goals - 10/04/17 1648      PT SHORT TERM GOAL #1   Title Pt will be independent in her HEP.   Time 4   Period Weeks   Status New   Target Date 11/01/17     PT SHORT TERM GOAL #2   Title pt will be able to report decreased hip pain to be able to sit for 40 minutes without increased pain   Time 4   Period Weeks   Status New   Target Date 11/01/17     PT SHORT TERM GOAL #3   Title pt will report she is able to lie on her side without increased shoulder pain    Time 4   Period Weeks   Status New   Target Date 11/01/17           PT Long Term Goals - 10/04/17 1651      PT LONG TERM GOAL #1   Title Pt will improve FOTO from 22% limitaion to 14% limitation.   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    Time 8   Period Weeks   Status New   Target Date 11/29/17     PT LONG TERM GOAL #3   Title pt will have 5/5 MMT left abduction and extension in order to maintain left pelvic alignment during functional activities   Time 8   Period Weeks   Status New   Target Date 11/29/17     PT LONG TERM GOAL #4   Title pt will be able to perform full flexion of Rt UE without scapular winging due to improved scapular strength and muscle coordination   Time 8   Period Weeks   Status New   Target Date 11/29/17                Plan - 10/04/17 1356    Clinical Impression Statement Patient presents to clinic due to left hip pain and right shoulder pain. Pt has been to clinic for the same shoulder  pain in the past.  The left hip is more recent and has been going on >3 months making it chronic at this time.  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  She has muscle spasms of right pec minor and major and upper trap with mid and low trap weakness.  Pt has pain from left iliac crest, tender to palpation of right SI joint.  Forward flexion movement test positive for left SI hypermobility.  Left innominate is anteriorly rotated and responds positively to MET.  Pt has core and left glute med weakness and rounded shoulders in sitting.  Pt will benefit from skilled PT to address impairments and improve functional of activities of daily living   History and Personal Factors relevant to plan of care: chronic pain in right shoulder, chronic hip pain of 6 months   Clinical Presentation Evolving   Clinical Presentation due to: pt has chronic shoulder and hip pain that has gotten worse recently   Clinical Decision Making Moderate   Rehab Potential Excellent   Clinical Impairments Affecting Rehab Potential pain of chronic nature   PT Frequency 2x / week   PT Duration 8 weeks   PT Treatment/Interventions ADLs/Self Care Home Management;Biofeedback;Cryotherapy;Electrical Stimulation;Iontophoresis 27m/ml Dexamethasone;Moist Heat;Traction;Ultrasound;Patient/family education;Dry needling;Taping;Manual techniques;Therapeutic exercise;Therapeutic activities;Gait training;Stair training;Functional mobility training   PT Next Visit Plan dry needling right: upper trap, pecs; mid and low trap and serratus strengthening to shoulder; dry needle left: glutes, lumbar paraspinals, pelvic alignment to rotate left pelvis posterior, core strength for deep abdominals   Consulted and Agree with Plan of Care Patient      Patient will benefit from skilled therapeutic intervention in order to improve the following deficits and impairments:  Decreased coordination, Decreased strength, Pain, Increased muscle spasms, Postural dysfunction  Visit Diagnosis: Other muscle spasm  Abnormal posture  Muscle weakness  (generalized)     Problem List Patient Active Problem List   Diagnosis Date Noted  . Ganglion cyst of dorsum of right wrist 11/01/2016  . Vitamin D deficiency 10/17/2016    JZannie Cove PT 10/04/2017, 5:02 PM  [facility] [address] [place] [address]: [phone]  Fax:  [phone] Name: RVannia PolaMRN: [id number] of Birth: [birth year]

## 2017-10-11 ENCOUNTER — Encounter: Payer: Self-pay | Admitting: Physical Therapy

## 2017-10-11 ENCOUNTER — Ambulatory Visit: Payer: PPO | Attending: Family Medicine | Admitting: Physical Therapy

## 2017-10-11 DIAGNOSIS — M542 Cervicalgia: Secondary | ICD-10-CM | POA: Insufficient documentation

## 2017-10-11 DIAGNOSIS — M62838 Other muscle spasm: Secondary | ICD-10-CM | POA: Diagnosis present

## 2017-10-11 DIAGNOSIS — R293 Abnormal posture: Secondary | ICD-10-CM

## 2017-10-11 DIAGNOSIS — M6281 Muscle weakness (generalized): Secondary | ICD-10-CM | POA: Diagnosis present

## 2017-10-11 NOTE — Patient Instructions (Addendum)
Back Hyperextension: Using Arms    Lying face down with arms bent, inhale. Then while exhaling, straighten arms. Hold __5__ seconds. Slowly return to starting position. Repeat __10__ times per set. Do __1__ sets per session. Do __3__ sessions per day.  Copyright  VHI. All rights reserved.    Piriformis Stretch, Sitting    Sit, one ankle on opposite knee, same-side hand on crossed knee. Push down on knee, keeping spine straight. Lean torso forward, with flat back, until tension is felt in hamstrings and gluteals of crossed-leg side. Hold _30__ seconds.  Repeat _3__ times per session. Do _1__ sessions per day.  Copyright  VHI. All rights reserved.   [facility] [address], [address] [place], [place] [address] Phone # [phone] Fax [phone]

## 2017-10-11 NOTE — Therapy (Signed)
[facility] [address] [place], [place], [address] Phone: [phone]   Fax:  [phone]  Physical Therapy Treatment  Patient Details  Name: Selena Diaz MRN: [id number] Date of Birth: [birth year] Referring Provider: Ethelda Chick, MD   Encounter Date: 10/11/2017  PT End of Session - 10/11/17 1449    Visit Number  2    Date for PT Re-Evaluation  11/29/17    PT Start Time  1449    PT Stop Time  1529    PT Time Calculation (min)  40 min    Activity Tolerance  Patient tolerated treatment well    Behavior During Therapy  [facility] for tasks assessed/performed       History reviewed. No pertinent past medical history.  History reviewed. No pertinent surgical history.  There were no vitals filed for this visit.  Subjective Assessment - 10/11/17 1453    Subjective  I have pain in my hip that is not going away no matter how I move.  It gets worse when I sit for a long time.  Shouler is not hurting today    Limitations  Sitting    How long can you sit comfortably?  30 minutes    Patient Stated Goals  get rid of the pain    Currently in Pain?  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  Yes    Education Handout Provided  -- information provided verbally, will give next   information provided verbally, will give next   Muscles Treated Upper Body  Quadratus Lumborum lumbar multifidi   lumbar multifidi   Muscles Treated Lower Body  Gluteus minimus;Gluteus maximus;Tensor fascia lata    Gluteus Maximus Response  Twitch response elicited;Palpable increased muscle length    Gluteus Minimus Response  Twitch response elicited;Palpable increased muscle length    Tensor Fascia Lata Response  Palpable increased muscle length           PT Education - 10/11/17 1530    Education provided  Yes    Education Details  prone press up and piriformis stretch    Person(s) Educated  Patient    Methods  Explanation;Demonstration;Handout;Verbal cues    Comprehension  Verbalized understanding;Returned demonstration  PT Short Term Goals - 10/04/17 1648      PT SHORT TERM GOAL #1   Title  Pt will be independent in her HEP.    Time  4    Period  Weeks    Status  New    Target Date  11/01/17      PT SHORT TERM GOAL #2   Title  pt will be able to report decreased hip pain to be able to sit for 40 minutes without increased pain    Time  4    Period  Weeks    Status  New    Target Date  11/01/17      PT SHORT TERM GOAL #3   Title  pt will report she is able to lie on her side without increased shoulder pain     Time  4    Period  Weeks    Status  New    Target Date  11/01/17        PT Long Term Goals - 10/04/17 1651      PT LONG TERM GOAL #1   Title  Pt will improve FOTO from 22% limitaion to 14% limitation.    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     Time  8    Period  Weeks    Status  New     Target Date  11/29/17      PT LONG TERM GOAL #3   Title  pt will have 5/5 MMT left abduction and extension in order to maintain left pelvic alignment during functional activities    Time  8    Period  Weeks    Status  New    Target Date  11/29/17      PT LONG TERM GOAL #4   Title  pt will be able to perform full flexion of Rt UE without scapular winging due to improved scapular strength and muscle coordination    Time  8    Period  Weeks    Status  New    Target Date  11/29/17            Plan - 10/11/17 1527    Clinical Impression Statement  Patient has tight and tender sacral ligaments on left>right.  She had several twitches in glute med and min and lumbar multifidi on left side.  Pt had more tightness on right side when palpating sacrotuberous ligament.  No increased pain with stretches added to HEP.  Did not have time to address shoulder during this session and will address at next visit.  Pt continue to benefit from skilled PT to work on pain management and improve core strength for improved functional activities.    PT Treatment/Interventions  ADLs/Self Care Home Management;Biofeedback;Cryotherapy;Electrical Stimulation;Iontophoresis 4mg /ml Dexamethasone;Moist Heat;Traction;Ultrasound;Patient/family education;Dry needling;Taping;Manual techniques;Therapeutic exercise;Therapeutic activities;Gait training;Stair training;Functional mobility training    PT Next Visit Plan  dry needling right: upper trap, pecs; mid and low trap and serratus strengthening to shoulder; dry needle left: glutes, lumbar paraspinals, pelvic alignment to rotate left pelvis posterior, core strength for deep abdominals    Consulted and Agree with Plan of Care  Patient  Patient will benefit from skilled therapeutic intervention in order to improve the following deficits and impairments:  Decreased coordination, Decreased strength, Pain, Increased muscle spasms, Postural dysfunction  Visit  Diagnosis: Other muscle spasm  Abnormal posture  Muscle weakness (generalized)  Cervicalgia     Problem List Patient Active Problem List   Diagnosis Date Noted  . Ganglion cyst of dorsum of right wrist 11/01/2016  . Vitamin D deficiency 10/17/2016    Vincente PoliJakki Crosser, PT 10/11/2017, 5:21 PM  [facility] [address] [place], [place], [address] Phone: [phone]   Fax:  [phone]  Name: Selena Diaz MRN: [id number] Date of Birth: May 13, 1986

## 2017-10-16 ENCOUNTER — Ambulatory Visit: Payer: PPO | Admitting: Physical Therapy

## 2017-10-16 DIAGNOSIS — M62838 Other muscle spasm: Secondary | ICD-10-CM

## 2017-10-16 DIAGNOSIS — M6281 Muscle weakness (generalized): Secondary | ICD-10-CM

## 2017-10-16 DIAGNOSIS — R293 Abnormal posture: Secondary | ICD-10-CM

## 2017-10-16 DIAGNOSIS — M542 Cervicalgia: Secondary | ICD-10-CM

## 2017-10-16 NOTE — Patient Instructions (Signed)
   TFL/ITB  Stretch  -standing, place your right foot behind the left and point your toes further to the left. Slowly lean your upper body over the left side. You will feel a stretch in the side of your right hip/side. Hold 30 sec x 3 times  [facility] [address], [address] [place], [place] [address] Phone # [phone] Fax [phone]

## 2017-10-16 NOTE — Therapy (Signed)
[facility] [address], [address] [place], [place], [address] Phone: [phone]   Fax:  [phone]  Physical Therapy Treatment  Patient Details  Name: Selena Diaz MRN: [id number] Date of Birth: Aug 17, 1986 Referring Provider: Ethelda ChickSmith, Kristi M, MD   Encounter Date: 10/16/2017  PT End of Session - 10/16/17 1316    Visit Number  3    Date for PT Re-Evaluation  11/29/17    PT Start Time  1232    PT Stop Time  1315    PT Time Calculation (min)  43 min    Activity Tolerance  Patient tolerated treatment well    Behavior During Therapy  [facility] for tasks assessed/performed       No past medical history on file.  No past surgical history on file.  There were no vitals filed for this visit.  Subjective Assessment - 10/16/17 1237    Subjective  My pain was worse after last session.  It increased when doing a lot of walking at the gym    Limitations  Sitting    How long can you sit comfortably?  30 minutes    Patient Stated Goals  get rid of the pain    Currently in Pain?  Yes    Pain Score  8                       [facility] Adult PT Treatment/Exercise - 10/16/17 0001      Exercises   Exercises  Neck      Neck Exercises: Seated   Neck Retraction  5 reps;10 secs      Lumbar Exercises: Standing   Other Standing Lumbar Exercises  TFL stretch, hip flexor stretch      Manual Therapy   Manual Therapy  Soft tissue mobilization    Soft tissue mobilization  right cerval and rhomboids, left TFL and glutes       Trigger Point Dry Needling - 10/16/17 1313    Muscles Treated Upper Body  Oblique capitus;Rhomboids;Suboccipitals muscle group;Upper trapezius    Upper Trapezius Response  Twitch reponse elicited;Palpable increased muscle length    Oblique Capitus Response  Twitch response elicited;Palpable increased muscle length    SubOccipitals Response  Twitch response elicited;Palpable increased muscle length    Rhomboids Response  Twitch response elicited;Palpable increased muscle length    Gluteus Minimus Response  Twitch response elicited;Palpable increased muscle length    Tensor Fascia Lata Response  Twitch response elicited;Palpable increased muscle length           PT Education - 10/16/17 1313    Education provided  Yes    Education Details  TFL stretch    Person(s) Educated  Patient    Methods  Explanation;Demonstration;Handout;Verbal cues;Tactile cues    Comprehension  Verbalized understanding;Returned demonstration       PT Short Term Goals - 10/16/17 1343      PT SHORT TERM GOAL #1   Title  Pt will be independent in her HEP.    Time  4    Period  Weeks    Status  On-going      PT SHORT TERM GOAL #2   Title  pt will be able to report decreased hip pain to be able to sit for 40 minutes without increased pain    Time  4    Period  Weeks    Status  On-going      PT SHORT TERM GOAL #3  Title  pt will report she is able to lie on her side without increased shoulder pain     Time  4    Period  Weeks    Status  On-going        PT Long Term Goals - 10/04/17 1651      PT LONG TERM GOAL #1   Title  Pt will improve FOTO from 22% limitaion to 14% limitation.    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     Time  8    Period  Weeks    Status  New    Target Date  11/29/17      PT LONG TERM GOAL #3   Title  pt will have 5/5 MMT left abduction and extension in order to maintain left pelvic alignment during functional activities    Time  8    Period  Weeks    Status  New    Target Date  11/29/17      PT LONG TERM GOAL #4   Title  pt will be able to perform full flexion of Rt UE without scapular winging due to improved scapular strength and muscle coordination    Time  8     Period  Weeks    Status  New    Target Date  11/29/17            Plan - 10/16/17 1337    Clinical Impression Statement  Patient had good twitch respones in TFL on left side as well as right UT, cervical paraspinals.  Pt has a lot of tenderness around gluteal bursea and nodules suggesting inflammation in that area.  She may benefit from into treatment to reduce inflammation.  Pt had no increased pain with hip flexior stretch and bridges.  She continues to need skilled PT for pain management and improve strength for full function and abilitty to do school related tasks    Rehab Potential  Excellent    Clinical Impairments Affecting Rehab Potential  pain of chronic nature    PT Treatment/Interventions  ADLs/Self Care Home Management;Biofeedback;Cryotherapy;Electrical Stimulation;Iontophoresis 4mg /ml Dexamethasone;Moist Heat;Traction;Ultrasound;Patient/family education;Dry needling;Taping;Manual techniques;Therapeutic exercise;Therapeutic activities;Gait training;Stair training;Functional mobility training    PT Next Visit Plan  ionto? print out dry needling info, glute and glute med, cervical strength, add c retraction to HEP    Consulted and Agree with Plan of Care  Patient       Patient will benefit from skilled therapeutic intervention in order to improve the following deficits and impairments:  Decreased coordination, Decreased strength, Pain, Increased muscle spasms, Postural dysfunction  Visit Diagnosis: Other muscle spasm  Abnormal posture  Muscle weakness (generalized)  Cervicalgia     Problem List Patient Active Problem List   Diagnosis Date Noted  . Ganglion cyst of dorsum of right wrist 11/01/2016  . Vitamin D deficiency 10/17/2016    Vincente PoliJakki Crosser, PT 10/16/2017, 1:45 PM  [facility] [address], [address] [place], [place], [address] Phone: [phone]   Fax:  [phone]  Name: Selena Diaz MRN:  [id number] Date of Birth: 14-Jan-1986

## 2017-10-18 ENCOUNTER — Ambulatory Visit: Payer: PPO | Admitting: Physical Therapy

## 2017-10-18 DIAGNOSIS — M542 Cervicalgia: Secondary | ICD-10-CM

## 2017-10-18 DIAGNOSIS — M62838 Other muscle spasm: Secondary | ICD-10-CM

## 2017-10-18 DIAGNOSIS — R293 Abnormal posture: Secondary | ICD-10-CM

## 2017-10-18 DIAGNOSIS — M6281 Muscle weakness (generalized): Secondary | ICD-10-CM

## 2017-10-18 NOTE — Patient Instructions (Addendum)
IONTOPHORESIS PATIENT PRECAUTIONS & CONTRAINDICATIONS:  . Redness under one or both electrodes can occur.  This characterized by a uniform redness that usually disappears within 12 hours of treatment. . Small pinhead size blisters may result in response to the drug.  Contact your physician if the problem persists more than 24 hours. . On rare occasions, iontophoresis therapy can result in temporary skin reactions such as rash, inflammation, irritation or burns.  The skin reactions may be the result of individual sensitivity to the ionic solution used, the condition of the skin at the start of treatment, reaction to the materials in the electrodes, allergies or sensitivity to dexamethasone, or a poor connection between the patch and your skin.  Discontinue using iontophoresis if you have any of these reactions and report to your therapist. . Remove the Patch or electrodes if you have any undue sensation of pain or burning during the treatment and report discomfort to your therapist. . Tell your Therapist if you have had known adverse reactions to the application of electrical current. . If using the Patch, the LED light will turn off when treatment is complete and the patch can be removed.  Approximate treatment time is 1-3 hours.  Remove the patch when light goes off or after 6 hours. . The Patch can be worn during normal activity, however excessive motion where the electrodes have been placed can cause poor contact between the skin and the electrode or uneven electrical current resulting in greater risk of skin irritation. . Keep out of the reach of children.   . DO NOT use if you have a cardiac pacemaker or any other electrically sensitive implanted device. . DO NOT use if you have a known sensitivity to dexamethasone. . DO NOT use during Magnetic Resonance Imaging (MRI). . DO NOT use over broken or compromised skin (e.g. sunburn, cuts, or acne) due to the increased risk of skin reaction. . DO  NOT SHAVE over the area to be treated:  To establish good contact between the Patch and the skin, excessive hair may be clipped. . DO NOT place the Patch or electrodes on or over your eyes, directly over your heart, or brain. . DO NOT reuse the Patch or electrodes as this may cause burns to occur.  Trigger Point Dry Needling  . What is Trigger Point Dry Needling (DN)? o DN is a physical therapy technique used to treat muscle pain and dysfunction. Specifically, DN helps deactivate muscle trigger points (muscle knots).  o A thin filiform needle is used to penetrate the skin and stimulate the underlying trigger point. The goal is for a local twitch response (LTR) to occur and for the trigger point to relax. No medication of any kind is injected during the procedure.   . What Does Trigger Point Dry Needling Feel Like?  o The procedure feels different for each individual patient. Some patients report that they do not actually feel the needle enter the skin and overall the process is not painful. Very mild bleeding may occur. However, many patients feel a deep cramping in the muscle in which the needle was inserted. This is the local twitch response.   . How Will I feel after the treatment? o Soreness is normal, and the onset of soreness may not occur for a few hours. Typically this soreness does not last longer than two days.  o Bruising is uncommon, however; ice can be used to decrease any possible bruising.  o In rare cases feeling tired or   after the treatment is normal. In addition, your symptoms may get worse before they get better, this period will typically not last longer than 24 hours.   . What Can I do After My Treatment? o Increase your hydration by drinking more water for the next 24 hours. o You may place ice or heat on the areas treated that have become sore, however, do not use heat on inflamed or bruised areas. Heat often brings more relief post needling. o You can continue  your regular activities, but vigorous activity is not recommended initially after the treatment for 24 hours. o DN is best combined with other physical therapy such as strengthening, stretching, and other therapies.    [facility] [address], [address] [place], [place] [address] Phone # [phone] Fax [phone]     SIDELYING CLAMSHELL  While lying on your side with your knees bent, draw up the top knee while keeping contact of your feet together.  Do not let your pelvis roll back during the lifting movement.   Do 2 x 10

## 2017-10-18 NOTE — Therapy (Signed)
[facility] [address], [address] [place], [place], [address] Phone: [phone]   Fax:  [phone]  Physical Therapy Treatment  Patient Details  Name: Selena Diaz MRN: [id number] Date of Birth: 10-30-1986 Referring Provider: Ethelda Chick, MD   Encounter Date: 10/18/2017  PT End of Session - 10/18/17 1504    Visit Number  4    Date for PT Re-Evaluation  11/29/17    PT Start Time  1450    PT Stop Time  1535    PT Time Calculation (min)  45 min    Activity Tolerance  Patient tolerated treatment well    Behavior During Therapy  [facility] for tasks assessed/performed       No past medical history on file.  No past surgical history on file.  There were no vitals filed for this visit.  Subjective Assessment - 10/18/17 1452    Subjective  My hip pain is very little bit better.  My shoulder felt a lot better after last session    Limitations  Sitting    Patient Stated Goals  get rid of the pain    Currently in Pain?  Yes    Pain Score  6     Pain Location  Hip    Pain Orientation  Left    Pain Descriptors / Indicators  Sharp    Pain Type  Chronic pain    Pain Onset  More than a month ago    Aggravating Factors   walking and pressing on it    Multiple Pain Sites  Yes    Pain Score  4    Pain Location  Shoulder    Pain Orientation  Right    Pain Descriptors / Indicators  Tightness    Pain Type  Chronic pain    Pain Onset  More than a month ago    Pain Frequency  Intermittent                      [facility] Adult PT Treatment/Exercise - 10/18/17 0001      Self-Care   Self-Care  Other Self-Care Comments    Other Self-Care Comments   patient educated on ionto care      Neuro Re-ed    Neuro Re-ed Details   abdominal engaged to stabilize pelvis throughout exercises      Lumbar Exercises: Supine   Ab Set  10 reps;5 seconds with and without red ball, with ball squeeze    Clam  20 reps red band    Bridge   10 reps;5 seconds with ball squeeze      Lumbar Exercises: Sidelying   Clam  10 reps bilateral      Modalities   Modalities  Iontophoresis      Iontophoresis   Type of Iontophoresis  Dexamethasone    Location  left TFL attachments at iliac crest    Dose  1.0ML    Time  4-6 hour release      Manual Therapy   Soft tissue mobilization  right cerval and rhomboids, left TFL and glutes       Trigger Point Dry Needling - 10/18/17 1545    Upper Trapezius Response  Twitch reponse elicited;Palpable increased muscle length    Oblique Capitus Response  Twitch response elicited;Palpable increased muscle length    SubOccipitals Response  Twitch response elicited;Palpable increased muscle length    Rhomboids Response  Twitch response elicited;Palpable increased muscle length  Gluteus Maximus Response  Twitch response elicited;Palpable increased muscle length    Gluteus Minimus Response  Twitch response elicited;Palpable increased muscle length    Tensor Fascia Lata Response  Twitch response elicited;Palpable increased muscle length           PT Education - 10/18/17 1503    Education provided  Yes    Education Details  ionto instructions, clamshell    Person(s) Educated  Patient    Methods  Explanation;Handout;Demonstration;Tactile cues;Verbal cues    Comprehension  Verbalized understanding;Returned demonstration       PT Short Term Goals - 10/18/17 1544      PT SHORT TERM GOAL #1   Title  Pt will be independent in her HEP.    Baseline  added exercise today (10/18/17)    Time  4    Period  Weeks    Status  On-going      PT SHORT TERM GOAL #2   Title  pt will be able to report decreased hip pain to be able to sit for 40 minutes without increased pain    Baseline  5% reduced    Time  4    Period  Weeks    Status  On-going      PT SHORT TERM GOAL #3   Title  pt will report she is able to lie on her side without increased shoulder pain     Baseline  has improved a lot     Time  4    Period  Weeks    Status  On-going        PT Long Term Goals - 10/04/17 1651      PT LONG TERM GOAL #1   Title  Pt will improve FOTO from 22% limitaion to 14% limitation.    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     Time  8    Period  Weeks    Status  New    Target Date  11/29/17      PT LONG TERM GOAL #3   Title  pt will have 5/5 MMT left abduction and extension in order to maintain left pelvic alignment during functional activities    Time  8    Period  Weeks    Status  New    Target Date  11/29/17      PT LONG TERM GOAL #4   Title  pt will be able to perform full flexion of Rt UE without scapular winging due to improved scapular strength and muscle coordination    Time  8    Period  Weeks    Status  New    Target Date  11/29/17            Plan - 10/18/17 1501    Clinical Impression Statement  Patient was able to engage abdominals with tactile and verbal cues.  clamshells on left side were challenging and patient needed verbal and tactile cues to keep abdominals engaged and prevent rolling backwards.  Pt had good response to dry needling and manual techniques for soft tissue lengthening. Pt needs skilled PT to porgress core and postural strength for return to functional activities.  Rehab Potential  Excellent    Clinical Impairments Affecting Rehab Potential  pain of chronic nature    PT Treatment/Interventions  ADLs/Self Care Home Management;Biofeedback;Cryotherapy;Electrical Stimulation;Iontophoresis 4mg /ml Dexamethasone;Moist Heat;Traction;Ultrasound;Patient/family education;Dry needling;Taping;Manual techniques;Therapeutic exercise;Therapeutic activities;Gait training;Stair training;Functional mobility training    PT Next Visit Plan  f/u on response to ionto  #1, scapular strength/stability, scalene stretch on right side, cervical strengthening, progress core strength, manual and dry needling as needed    Consulted and Agree with Plan of Care  Patient       Patient will benefit from skilled therapeutic intervention in order to improve the following deficits and impairments:  Decreased coordination, Decreased strength, Pain, Increased muscle spasms, Postural dysfunction  Visit Diagnosis: Other muscle spasm  Abnormal posture  Muscle weakness (generalized)  Cervicalgia     Problem List Patient Active Problem List   Diagnosis Date Noted  . Ganglion cyst of dorsum of right wrist 11/01/2016  . Vitamin D deficiency 10/17/2016    Vincente PoliJakki Crosser, PT 10/18/2017, 3:50 PM  [facility] [address], [address] [place], [place], [address] Phone: [phone]   Fax:  [phone]  Name: Selena Diaz MRN: [id number] Date of Birth: [birth year]

## 2017-10-23 ENCOUNTER — Ambulatory Visit: Payer: PPO | Admitting: Physical Therapy

## 2017-10-23 DIAGNOSIS — M6281 Muscle weakness (generalized): Secondary | ICD-10-CM

## 2017-10-23 DIAGNOSIS — R293 Abnormal posture: Secondary | ICD-10-CM

## 2017-10-23 DIAGNOSIS — M62838 Other muscle spasm: Secondary | ICD-10-CM

## 2017-10-23 DIAGNOSIS — M542 Cervicalgia: Secondary | ICD-10-CM

## 2017-10-23 NOTE — Therapy (Signed)
[facility] [address], [address] [place], [place], [address] Phone: [phone]   Fax:  [phone]  Physical Therapy Treatment  Patient Details  Name: Selena DeedsReem Goldwasser MRN: [id number] Date of Birth: [birth year] Referring Provider: Ethelda ChickSmith, Kristi M, MD   Encounter Date: 10/23/2017  PT End of Session - 10/23/17 1505    Visit Number  5    Date for PT Re-Evaluation  11/29/17 12/12 [place] for [place]    PT Start Time  1447    PT Stop Time  1535    PT Time Calculation (min)  48 min    Activity Tolerance  Patient tolerated treatment well    Behavior During Therapy  [facility] tasks assessed/performed       No past medical history on file.  No past surgical history on file.  There were no vitals filed for this visit.  Subjective Assessment - 10/23/17 1453    Subjective  I don't have the pain when I touch my hip now and it is not hurting for as long when I get up out of the chair.      Limitations  Sitting    How long can you sit comfortably?  30 minutes    Patient Stated Goals  get rid of the pain    Currently in Pain?  Yes    Pain Score  6     Pain Location  Hip    Pain Orientation  Left    Pain Descriptors / Indicators  Sharp    Pain Type  Chronic pain    Pain Onset  More than a month ago    Pain Frequency  Intermittent    Aggravating Factors   walking putting weight after sitting    Multiple Pain Sites  Yes    Pain Score  7    Pain Location  Shoulder    Pain Orientation  Right    Pain Descriptors / Indicators  -- pulling    Pain Type  Chronic pain    Pain Onset  More than a month ago    Pain Frequency  Intermittent    Aggravating Factors   cold weather    Pain Relieving Factors  massage and needling                      [facility] Adult PT Treatment/Exercise - 10/23/17 0001      Neuro Re-ed    Neuro Re-ed Details   core bracing and scapular movements with tactile feedback      Neck  Exercises: Supine   Other Supine Exercise  lying supine on foam roll pec stretch; horizontal abduction and serratus punch - 20x each      Lumbar Exercises: Supine   Bridge  10 reps;5 seconds with shoulder press into mat      Lumbar Exercises: Sidelying   Clam  20 reps      Iontophoresis   Type of Iontophoresis  Dexamethasone    Location  left TFL attachments at iliac crest    Dose  1.0ML    Time  4-6 hour release #2; lot #[id number]      Manual Therapy   Soft tissue mobilization  right cerval and rhomboids, left TFL and glutes       Trigger Point Dry Needling - 10/23/17 1536    Consent Given?  Yes    Upper Trapezius Response  Twitch reponse elicited;Palpable increased muscle length  Oblique Capitus Response  Twitch response elicited;Palpable increased muscle length    Rhomboids Response  Twitch response elicited;Palpable increased muscle length    Gluteus Maximus Response  Twitch response elicited;Palpable increased muscle length    Tensor Fascia Lata Response  Twitch response elicited;Palpable increased muscle length             PT Short Term Goals - 10/23/17 1538      PT SHORT TERM GOAL #1   Title  Pt will be independent in her HEP.    Time  4    Period  Weeks    Status  Achieved      PT SHORT TERM GOAL #2   Title  pt will be able to report decreased hip pain to be able to sit for 40 minutes without increased pain    Time  4    Period  Weeks    Status  On-going      PT SHORT TERM GOAL #3   Title  pt will report she is able to lie on her side without increased shoulder pain     Time  4    Period  Weeks    Status  On-going        PT Long Term Goals - 10/04/17 1651      PT LONG TERM GOAL #1   Title  Pt will improve FOTO from 22% limitaion to 14% limitation.    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     Time  8    Period  Weeks    Status  New    Target Date  11/29/17      PT LONG TERM GOAL #3   Title  pt will have 5/5 MMT left abduction and extension in order to maintain left pelvic alignment during functional activities    Time  8    Period  Weeks    Status  New    Target Date  11/29/17      PT LONG TERM GOAL #4   Title  pt will be able to perform full flexion of Rt UE without scapular winging due to improved scapular strength and muscle coordination    Time  8    Period  Weeks    Status  New    Target Date  11/29/17            Plan - 10/23/17 1540    Clinical Impression Statement  Patient is independent with initial HEP and has been slowly improving towards goals with reduced pain.  Pt felt reduced pain in shoulder with retraction in supine.  Pt was educated on chest opening movements and the importance of relaxing into pec stretch every day.  Pt will benefit from skilled PT to continue to work on strength and pain management for hip and shoulder.    Rehab Potential  Excellent    Clinical Impairments Affecting Rehab Potential  pain of chronic nature    PT Treatment/Interventions  ADLs/Self Care Home Management;Biofeedback;Cryotherapy;Electrical Stimulation;Iontophoresis 4mg /ml Dexamethasone;Moist Heat;Traction;Ultrasound;Patient/family education;Dry needling;Taping;Manual techniques;Therapeutic exercise;Therapeutic activities;Gait training;Stair training;Functional mobility training    PT Next Visit Plan  ionto #3, dry needling, chest opening movements, core and  hip strength, scalene and SCM stretches    Consulted and Agree with Plan of Care  Patient       Patient will benefit from skilled therapeutic intervention in order to improve the following deficits and impairments:  Decreased coordination, Decreased strength, Pain, Increased muscle spasms, Postural dysfunction  Visit Diagnosis: Other muscle spasm  Abnormal  posture  Muscle weakness (generalized)  Cervicalgia     Problem List Patient Active Problem List   Diagnosis Date Noted  . Ganglion cyst of dorsum of right wrist 11/01/2016  . Vitamin D deficiency 10/17/2016    Vincente Poli, PT 10/23/2017, 3:46 PM  [facility] [address] [place], [place], [address] Phone: [phone]   Fax:  [phone]  Name: Anjana Cheek MRN: [id number] Date of Birth: [birth year]

## 2017-10-28 ENCOUNTER — Other Ambulatory Visit: Payer: Self-pay | Admitting: Physician Assistant

## 2017-10-30 ENCOUNTER — Ambulatory Visit: Payer: PPO | Admitting: Physical Therapy

## 2017-10-30 DIAGNOSIS — M542 Cervicalgia: Secondary | ICD-10-CM

## 2017-10-30 DIAGNOSIS — M6281 Muscle weakness (generalized): Secondary | ICD-10-CM

## 2017-10-30 DIAGNOSIS — R293 Abnormal posture: Secondary | ICD-10-CM

## 2017-10-30 DIAGNOSIS — M62838 Other muscle spasm: Secondary | ICD-10-CM | POA: Diagnosis not present

## 2017-10-30 NOTE — Patient Instructions (Signed)
   ELASTIC BAND SCAPULAR RETRACTIONS WITH MINI SHOULDER EXTENSIONS  While holding an elastic band with both arms in front of you with your elbows straight, squeeze your shoulder blades together as you pull the band back. Be sure your shoulders do not raise up.          2 sets of 10 of the above exercises daily  [facility] [address], [address] [place], [place] [address] Phone # [phone] Fax [phone]

## 2017-10-30 NOTE — Therapy (Signed)
[facility] [address], [address] [place], [place], [address] Phone: [phone]   Fax:  [phone]  Physical Therapy Treatment  Patient Details  Name: Selena Diaz MRN: [id number] Date of Birth: January 12, 1986 Referring Provider: Ethelda ChickSmith, Kristi M, MD   Encounter Date: 10/30/2017  PT End of Session - 10/30/17 1537    Visit Number  6    Date for PT Re-Evaluation  11/29/17    PT Start Time  1533    PT Stop Time  1618    PT Time Calculation (min)  45 min    Activity Tolerance  Patient tolerated treatment well    Behavior During Therapy  [facility] for tasks assessed/performed       No past medical history on file.  No past surgical history on file.  There were no vitals filed for this visit.  Subjective Assessment - 10/30/17 1620    Subjective  I have been in more pain ever since this last weekend driving to [place].      Currently in Pain?  Yes    Pain Score  7     Pain Location  Hip    Pain Orientation  Left    Pain Descriptors / Indicators  Sharp    Pain Onset  More than a month ago    Pain Frequency  Intermittent    Multiple Pain Sites  Yes    Pain Score  7    Pain Location  Shoulder    Pain Orientation  Right                      [facility] Adult PT Treatment/Exercise - 10/30/17 0001      Neck Exercises: Standing   Other Standing Exercises  horizontal abduction, ER with cues for postural alignment      Lumbar Exercises: Quadruped   Single Arm Raise  Right;Left;5 reps      Knee/Hip Exercises: Standing   Hip Abduction  Stengthening;Right;Left;10 reps    Hip Extension  Stengthening;Right;Left;10 reps      Shoulder Exercises: Standing   Horizontal ABduction  Strengthening;Both;20 reps;Theraband    Theraband Level (Shoulder Horizontal ABduction)  Level 1 (Yellow)    External Rotation  Strengthening;Both;20 reps;Theraband    Theraband Level (Shoulder External Rotation)  Level 1 (Yellow)    Extension   Strengthening;Both;15 reps;Theraband    Theraband Level (Shoulder Extension)  Level 1 (Yellow)    Row  Strengthening;15 reps;Theraband    Theraband Level (Shoulder Row)  Level 3 (Green)      Manual Therapy   Soft tissue mobilization  right periscapular muscles       Trigger Point Dry Needling - 10/30/17 1622    Muscles Treated Upper Body  Subscapularis    Upper Trapezius Response  Twitch reponse elicited;Palpable increased muscle length    Rhomboids Response  Twitch response elicited;Palpable increased muscle length    Subscapularis Response  Twitch response elicited;Palpable increased muscle length           PT Education - 10/30/17 1619    Education provided  Yes    Education Details  shoulder tband and standing hip ext and abduction    Person(s) Educated  Patient    Methods  Explanation;Demonstration;Handout    Comprehension  Verbalized understanding;Returned demonstration       PT Short Term Goals - 10/23/17 1538      PT SHORT TERM GOAL #1   Title  Pt will be independent in her  HEP.    Time  4    Period  Weeks    Status  Achieved      PT SHORT TERM GOAL #2   Title  pt will be able to report decreased hip pain to be able to sit for 40 minutes without increased pain    Time  4    Period  Weeks    Status  On-going      PT SHORT TERM GOAL #3   Title  pt will report she is able to lie on her side without increased shoulder pain     Time  4    Period  Weeks    Status  On-going        PT Long Term Goals - 10/30/17 1649      PT LONG TERM GOAL #1   Title  Pt will improve FOTO from 22% limitaion to 14% limitation.    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     Time  8    Period  Weeks    Status  On-going      PT LONG TERM GOAL #3   Title  pt will have 5/5 MMT left abduction and  extension in order to maintain left pelvic alignment during functional activities    Time  8    Period  Weeks    Status  On-going      PT LONG TERM GOAL #4   Title  pt will be able to perform full flexion of Rt UE without scapular winging due to improved scapular strength and muscle coordination    Time  8    Period  Weeks    Status  On-going            Plan - 10/30/17 1646    Clinical Impression Statement  Patient has noticeably less muscle spasms in shoulder. She was able to focus more on strengthening today.  Pt had a difficulty with pelvic stability needing verbal and tactile cues to perform exercises with correct alignment.  Skilled PT needed for strength and posture to perform functional activities without pain.    PT Treatment/Interventions  ADLs/Self Care Home Management;Biofeedback;Cryotherapy;Electrical Stimulation;Iontophoresis 4mg /ml Dexamethasone;Moist Heat;Traction;Ultrasound;Patient/family education;Dry needling;Taping;Manual techniques;Therapeutic exercise;Therapeutic activities;Gait training;Stair training;Functional mobility training    PT Next Visit Plan  manual and dry needling as needed, chest opening movements, core and hip strength, scalene and SCM stretches, quadruped    Consulted and Agree with Plan of Care  Patient       Patient will benefit from skilled therapeutic intervention in order to improve the following deficits and impairments:  Decreased coordination, Decreased strength, Pain, Increased muscle spasms, Postural dysfunction  Visit Diagnosis: Other muscle spasm  Abnormal posture  Muscle weakness (generalized)  Cervicalgia     Problem List Patient Active Problem List   Diagnosis Date Noted  . Ganglion cyst of dorsum of right wrist 11/01/2016  . Vitamin D deficiency 10/17/2016    Vincente PoliJakki Crosser, PT 10/30/2017, 4:50 PM  [facility] [address], [address] [place], [place],  [address] Phone: [phone]   Fax:  [phone]  Name: Selena Diaz MRN: [id number] Date of Birth: 09/14/1986

## 2017-11-01 ENCOUNTER — Encounter: Payer: Self-pay | Admitting: Physical Therapy

## 2017-11-01 ENCOUNTER — Ambulatory Visit: Payer: PPO | Admitting: Physical Therapy

## 2017-11-01 DIAGNOSIS — M62838 Other muscle spasm: Secondary | ICD-10-CM

## 2017-11-01 DIAGNOSIS — R293 Abnormal posture: Secondary | ICD-10-CM

## 2017-11-01 DIAGNOSIS — M542 Cervicalgia: Secondary | ICD-10-CM

## 2017-11-01 DIAGNOSIS — M6281 Muscle weakness (generalized): Secondary | ICD-10-CM

## 2017-11-01 NOTE — Therapy (Signed)
[facility] [address], [address] [place], [place], [address] Phone: [phone]   Fax:  [phone]  Physical Therapy Treatment  Patient Details  Name: Selena Diaz MRN: [id number] Date of Birth: [birth year] Referring Provider: Ethelda Chick, MD   Encounter Date: 11/01/2017  PT End of Session - 11/01/17 1513    Visit Number  7    Date for PT Re-Evaluation  11/29/17    PT Start Time  1514    PT Stop Time  1600    PT Time Calculation (min)  46 min    Activity Tolerance  Patient tolerated treatment well    Behavior During Therapy  [facility] for tasks assessed/performed       History reviewed. No pertinent past medical history.  History reviewed. No pertinent surgical history.  There were no vitals filed for this visit.  Subjective Assessment - 11/01/17 1516    Subjective  I had no time to do the exercises.  My hip feels the same.  The shoulder is better but the pain moved to the upper trap (right).    Limitations  Sitting    How long can you sit comfortably?  30 minutes    Patient Stated Goals  get rid of the pain    Currently in Pain?  Yes    Pain Score  8     Pain Location  Hip    Pain Orientation  Left    Pain Descriptors / Indicators  Sharp    Pain Type  Chronic pain    Pain Onset  More than a month ago    Pain Frequency  Intermittent    Aggravating Factors   sitting is worse then walking, hurts while sitting and then continues afterwards    Pain Relieving Factors  nothing    Effect of Pain on Daily Activities  sitting to doing school work    Multiple Pain Sites  Yes    Pain Score  6    Pain Location  Shoulder    Pain Orientation  Right    Pain Descriptors / Indicators  Tightness    Pain Type  Chronic pain    Pain Onset  More than a month ago    Pain Frequency  Intermittent    Aggravating Factors   on the computer    Pain Relieving Factors  rest    Effect of Pain on Daily Activities  house work; school work                       [facility] Adult PT Treatment/Exercise - 11/01/17 0001      Neuro Re-ed    Neuro Re-ed Details   seated on ball for core activation and cervcal exercises for improved posture awareness throughout the day; sit to stand with good glute and core activation      Neck Exercises: Seated   Neck Retraction  5 reps;5 secs    Other Seated Exercise  cervical retraction with ext 5 x 5 sec    Other Seated Exercise  scap squeeze and depress 5 x 5sec      Lumbar Exercises: Seated   Long Arc Quad on [place]  Strengthening;Right;Left;20 reps    Sit to Stand  20 reps slowly lowering and squeeze with pelvic tilt on stand      Lumbar Exercises: Supine   Bridge  10 reps;5 seconds with shoulder press into mat    Straight Leg Raise  15  reps      Lumbar Exercises: Sidelying   Hip Abduction  15 reps    Other Sidelying Lumbar Exercises  hip adduction 15  reps      Lumbar Exercises: Prone   Straight Leg Raise  15 reps      Manual Therapy   Soft tissue mobilization  right periscapular muscles, thoracic multifidi, upper trap, suboccipitals       Trigger Point Dry Needling - 11/01/17 1606    Consent Given?  Yes    Muscles Treated Upper Body  -- thoracic multifidi T4-6 Rt side only    Upper Trapezius Response  Twitch reponse elicited;Palpable increased muscle length    SubOccipitals Response  Twitch response elicited;Palpable increased muscle length    Rhomboids Response  Twitch response elicited;Palpable increased muscle length           PT Education - 11/01/17 1605    Education provided  Yes    Education Details  hip 4 ways on mat; cervical retraction, retract with ext, scap squeezes    Person(s) Educated  Patient    Methods  Explanation;Demonstration;Verbal cues;Handout    Comprehension  Verbalized understanding;Returned demonstration       PT Short Term Goals - 10/23/17 1538      PT SHORT TERM GOAL #1   Title  Pt will be independent in her HEP.    Time  4     Period  Weeks    Status  Achieved      PT SHORT TERM GOAL #2   Title  pt will be able to report decreased hip pain to be able to sit for 40 minutes without increased pain    Time  4    Period  Weeks    Status  On-going      PT SHORT TERM GOAL #3   Title  pt will report she is able to lie on her side without increased shoulder pain     Time  4    Period  Weeks    Status  On-going        PT Long Term Goals - 10/30/17 1649      PT LONG TERM GOAL #1   Title  Pt will improve FOTO from 22% limitaion to 14% limitation.    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     Time  8    Period  Weeks    Status  On-going      PT LONG TERM GOAL #3   Title  pt will have 5/5 MMT left abduction and extension in order to maintain left pelvic alignment during functional activities    Time  8    Period  Weeks    Status  On-going      PT LONG TERM GOAL #4   Title  pt will be able to perform full flexion of Rt UE without scapular winging due to improved scapular strength and muscle coordination    Time  8    Period  Weeks    Status  On-going            Plan - 11/01/17 1514    Clinical Impression Statement  Patient was challenged with exercises but able to  perform correctly and without pain.  Pt felt some relief from dry needling today.  She continues to need skilled PT for strengthening.  She was educated on importance of taking a little time each day to do the exercises due to weakness in hip and core.      Rehab Potential  Excellent    Clinical Impairments Affecting Rehab Potential  pain of chronic nature    PT Treatment/Interventions  ADLs/Self Care Home Management;Biofeedback;Cryotherapy;Electrical Stimulation;Iontophoresis 4mg /ml Dexamethasone;Moist Heat;Traction;Ultrasound;Patient/family education;Dry  needling;Taping;Manual techniques;Therapeutic exercise;Therapeutic activities;Gait training;Stair training;Functional mobility training    PT Next Visit Plan  quadruped, review HEP hip series, core strength, DN to Rt shoulder if needed    Consulted and Agree with Plan of Care  Patient       Patient will benefit from skilled therapeutic intervention in order to improve the following deficits and impairments:  Decreased coordination, Decreased strength, Pain, Increased muscle spasms, Postural dysfunction  Visit Diagnosis: Other muscle spasm  Abnormal posture  Muscle weakness (generalized)  Cervicalgia     Problem List Patient Active Problem List   Diagnosis Date Noted  . Ganglion cyst of dorsum of right wrist 11/01/2016  . Vitamin D deficiency 10/17/2016    Vincente PoliJakki Crosser, PT 11/01/2017, 4:13 PM  [facility] [address] [place], [place], [address] Phone: [phone]   Fax:  [phone]  Name: Selena Diaz MRN: [id number] Date of Birth: 08-08-1986

## 2017-11-01 NOTE — Patient Instructions (Addendum)
         Do the above 4 exercises 2 sets of 10 every day     Retraction Extension MDT  Sit up tall and begin with your neck in a resting position. Next, tuck your chin back as indicated by the arrow. Then, look up as far as possible. Hold 5 sec and feel muscles in the front of the neck engaged Return to resting position. Maintain a good posture throughout the exercise. Repeat 5x. Do 5x/day    Place hands behind neck and do the above movemnt - hold 5 sec repeat 5x. Do 5x/day    Bring shoulders down and back and hold 5 sec and repeat 5x, do 5x/day  [facility] [address], [address] [place], [place] [address] Phone # [phone] Fax [phone]

## 2017-11-06 ENCOUNTER — Encounter: Payer: No Typology Code available for payment source | Admitting: Physical Therapy

## 2017-11-07 ENCOUNTER — Ambulatory Visit: Payer: 59 | Attending: Family Medicine | Admitting: Physical Therapy

## 2017-11-07 ENCOUNTER — Encounter: Payer: Self-pay | Admitting: Physical Therapy

## 2017-11-07 ENCOUNTER — Other Ambulatory Visit: Payer: Self-pay | Admitting: Physician Assistant

## 2017-11-07 ENCOUNTER — Encounter: Payer: No Typology Code available for payment source | Admitting: Physical Therapy

## 2017-11-07 DIAGNOSIS — R293 Abnormal posture: Secondary | ICD-10-CM | POA: Diagnosis present

## 2017-11-07 DIAGNOSIS — M62838 Other muscle spasm: Secondary | ICD-10-CM | POA: Diagnosis present

## 2017-11-07 DIAGNOSIS — M6281 Muscle weakness (generalized): Secondary | ICD-10-CM | POA: Diagnosis present

## 2017-11-07 DIAGNOSIS — M542 Cervicalgia: Secondary | ICD-10-CM | POA: Diagnosis present

## 2017-11-07 NOTE — Therapy (Signed)
[facility] [address] [place], [place], [address] Phone: [phone]   Fax:  [phone]  Physical Therapy Treatment  Patient Details  Name: Selena Diaz MRN: [id number] Date of Birth: 20-May-1986 Referring Provider: Ethelda ChickSmith, Kristi M, MD   Encounter Date: 11/07/2017  PT End of Session - 11/07/17 1502    Visit Number  8    Date for PT Re-Evaluation  11/29/17    PT Start Time  1447    PT Stop Time  1529    PT Time Calculation (min)  42 min    Activity Tolerance  Patient tolerated treatment well    Behavior During Therapy  [facility] assessed/performed       History reviewed. No pertinent past medical history.  History reviewed. No pertinent surgical history.  There were no vitals filed for this visit.  Subjective Assessment - 11/07/17 1452    Subjective  My pain is better.  The dry needle seemed to help my neck and shoulder    Limitations  Sitting    How long can you sit comfortably?  30 minutes    Patient Stated Goals  get rid of the pain    Currently in Pain?  Yes    Pain Score  5     Pain Location  Hip    Pain Orientation  Left    Pain Descriptors / Indicators  Sore lighter than last time    Pain Type  Chronic pain    Pain Onset  More than a month ago    Pain Frequency  Intermittent    Multiple Pain Sites  Yes    Pain Score  5    Pain Location  Shoulder    Pain Orientation  Right    Pain Descriptors / Indicators  Tightness    Pain Type  Chronic pain    Pain Onset  More than a month ago    Pain Frequency  Intermittent    Aggravating Factors   on the computer    Pain Relieving Factors  rest    Effect of Pain on Daily Activities  school work                      [facility] - 11/07/17 0001      Neuro Re-ed    Neuro Re-ed Details   posture and core control      Neck Exercises: Seated   Neck Retraction  5 reps;5 secs    Other Seated Exercise  cervical  retraction with ext 5 x 5 sec      Neck Exercises: Prone   W Back  20 reps    Shoulder Extension  20 reps horizontal abduction      Lumbar Exercises: Supine   Straight Leg Raise  20 reps      Lumbar Exercises: Sidelying   Hip Abduction  20 reps      Lumbar Exercises: Prone   Straight Leg Raise  20 reps      Manual Therapy   Soft tissue mobilization  right periscapular muscles, thoracic multifidi, upper trap, suboccipitals       Trigger Point Dry Needling - 11/07/17 1504    Consent Given?  Yes    Upper Trapezius Response  Twitch reponse elicited;Palpable increased muscle length    SubOccipitals Response  Twitch response elicited;Palpable increased muscle length             PT Short  Term Goals - 11/07/17 1530      PT SHORT TERM GOAL #2   Title  pt will be able to report decreased hip pain to be able to sit for 40 minutes without increased pain    Time  4    Period  Weeks    Status  On-going      PT SHORT TERM GOAL #3   Title  pt will report she is able to lie on her side without increased shoulder pain     Time  4    Period  Weeks    Status  On-going        PT Long Term Goals - 11/07/17 1531      PT LONG TERM GOAL #1   Title  Pt will improve FOTO from 22% limitaion to 14% limitation.    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     Baseline  able to sit for working on school work for 2 hours; has some increased pain after but not during    Time  8    Period  Weeks    Status  Achieved            Plan - 11/07/17 1531    Clinical Impression Statement  Patient has achieved sitting goal and is able to tolerate more sitting.  She is experiencing overall less pain.  Pt responding well to soft tissue mobs and dry needling.  Pt demonstrates core and upper back weakness needing cues to  perform exercises correctly for greater postural control . Pt benefitting from skilled PT to improve strength and function.    Rehab Potential  Excellent    Clinical Impairments Affecting Rehab Potential  pain of chronic nature    PT Treatment/Interventions  ADLs/Self Care Home Management;Biofeedback;Cryotherapy;Electrical Stimulation;Iontophoresis 4mg /ml Dexamethasone;Moist Heat;Traction;Ultrasound;Patient/family education;Dry needling;Taping;Manual techniques;Therapeutic exercise;Therapeutic activities;Gait training;Stair training;Functional mobility training    PT Next Visit Plan  start with UBE?, quadruped, cont to work on HEP, core strength.  STM to Rt shoulder    Consulted and Agree with Plan of Care  Patient       Patient will benefit from skilled therapeutic intervention in order to improve the following deficits and impairments:  Decreased coordination, Decreased strength, Pain, Increased muscle spasms, Postural dysfunction  Visit Diagnosis: Other muscle spasm  Abnormal posture  Muscle weakness (generalized)  Cervicalgia     Problem List Patient Active Problem List   Diagnosis Date Noted  . Ganglion cyst of dorsum of right wrist 11/01/2016  . Vitamin D deficiency 10/17/2016    Vincente PoliJakki Crosser, PT 11/07/2017, 3:34 PM  [facility] [address] [place], [place], [address] Phone: [phone]   Fax:  [phone]  Name: Selena Diaz MRN: [id number] Date of Birth: 1986-10-24

## 2017-11-09 ENCOUNTER — Encounter: Payer: Self-pay | Admitting: Physical Therapy

## 2017-11-09 ENCOUNTER — Ambulatory Visit: Payer: 59 | Admitting: Physical Therapy

## 2017-11-09 DIAGNOSIS — M542 Cervicalgia: Secondary | ICD-10-CM

## 2017-11-09 DIAGNOSIS — M62838 Other muscle spasm: Secondary | ICD-10-CM

## 2017-11-09 DIAGNOSIS — M6281 Muscle weakness (generalized): Secondary | ICD-10-CM

## 2017-11-09 DIAGNOSIS — R293 Abnormal posture: Secondary | ICD-10-CM

## 2017-11-09 NOTE — Therapy (Addendum)
[facility] [address] [place], [place], [address] Phone: [phone]   Fax:  [phone]  Physical Therapy Treatment  Patient Details  Name: Catherina Pates MRN: [id number] Date of Birth: 02/29/1986 Referring Provider: Wardell Honour, MD   Encounter Date: 11/09/2017  PT End of Session - 11/09/17 1526    Visit Number  9    Date for PT Re-Evaluation  11/29/17    PT Start Time  8891    PT Stop Time  1616    PT Time Calculation (min)  45 min    Activity Tolerance  Patient tolerated treatment well    Behavior During Therapy  [facility] for tasks assessed/performed       History reviewed. No pertinent past medical history.  History reviewed. No pertinent surgical history.  There were no vitals filed for this visit.  Subjective Assessment - 11/09/17 1534    Subjective  Pain is still better, but still there.  Still pain with sitting.  Pain is in the hip and along the scapula    Limitations  Sitting    How long can you sit comfortably?  30 minutes    Patient Stated Goals  get rid of the pain    Currently in Pain?  Yes    Pain Score  5     Pain Location  Hip    Pain Orientation  Left;Lateral    Pain Descriptors / Indicators  Sore    Pain Type  Chronic pain    Pain Onset  1 to 4 weeks ago    Pain Frequency  Intermittent    Aggravating Factors   sitting for long time and then standing    Pain Relieving Factors  it doesn't hurt     Effect of Pain on Daily Activities  standing after sitting and trying to walk    Multiple Pain Sites  Yes    Pain Score  5    Pain Location  Scapula    Pain Orientation  Right;Medial    Pain Type  Chronic pain    Pain Onset  More than a month ago    Pain Frequency  Intermittent    Aggravating Factors   on the computer    Pain Relieving Factors  rest    Effect of Pain on Daily Activities  school work and sitting                      [facility] Adult PT Treatment/Exercise -  11/09/17 0001      Neck Exercises: Supine   Other Supine Exercise  horizontal abduction supine yellow band; W in prone - 10x each way    Other Supine Exercise  ER supine yellow, diagonals - 10x each way bilteral      Lumbar Exercises: Supine   Other Supine Lumbar Exercises  FOam roll melt method - lumbar fascia release with rocking and knee hugs; lying with verticle roll for shoulder and pec release      Manual Therapy   Manual Therapy  Myofascial release    Soft tissue mobilization  right periscapular muscles, thoracic multifidi, upper trap, suboccipitals    Myofascial Release  suboccipital and cervical paraspinal       Trigger Point Dry Needling - 11/09/17 1600    Consent Given?  Yes    Muscles Treated Upper Body  -- thoracic T2-5 multifidi +twitch and length    Rhomboids Response  Twitch response elicited;Palpable increased muscle  length             PT Short Term Goals - 11/07/17 1530      PT SHORT TERM GOAL #2   Title  pt will be able to report decreased hip pain to be able to sit for 40 minutes without increased pain    Time  4    Period  Weeks    Status  On-going      PT SHORT TERM GOAL #3   Title  pt will report she is able to lie on her side without increased shoulder pain     Time  4    Period  Weeks    Status  On-going        PT Long Term Goals - 11/09/17 1542      PT LONG TERM GOAL #1   Title  Pt will improve FOTO from 22% limitaion to 14% limitation.    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     Time  8    Period  Weeks    Status  Achieved      PT LONG TERM GOAL #3   Title  pt will have 5/5 MMT left abduction and extension in order to maintain left pelvic alignment during functional activities    Time  8    Period  Weeks    Status  Achieved            Plan -  11/09/17 1616    Clinical Impression Statement  Pt continues to progress and met her long term goal for increased hip strength. Pt has been more consistent with HEP and is maintaining a lower level of pain. Pt will benefit from skilled PT to progress strength and ROM in order to tolerate long distance traveling and functional tasks she needs to do while sitting.    Rehab Potential  Excellent    Clinical Impairments Affecting Rehab Potential  pain of chronic nature    PT Treatment/Interventions  ADLs/Self Care Home Management;Biofeedback;Cryotherapy;Electrical Stimulation;Iontophoresis 40m/ml Dexamethasone;Moist Heat;Traction;Ultrasound;Patient/family education;Dry needling;Taping;Manual techniques;Therapeutic exercise;Therapeutic activities;Gait training;Stair training;Functional mobility training    PT Next Visit Plan  FOTO start with UBE?, quadruped, cont to work on HEP, core strength.  STM to Rt shoulder    Consulted and Agree with Plan of Care  Patient       Patient will benefit from skilled therapeutic intervention in order to improve the following deficits and impairments:  Decreased coordination, Decreased strength, Pain, Increased muscle spasms, Postural dysfunction  Visit Diagnosis: Other muscle spasm  Abnormal posture  Muscle weakness (generalized)  Cervicalgia     Problem List Patient Active Problem List   Diagnosis Date Noted  . Ganglion cyst of dorsum of right wrist 11/01/2016  . Vitamin D deficiency 10/17/2016    JZannie Cove PT 11/09/2017, 4:24 PM  [facility] [address] [place] [address]Phone: [phone]  Fax:  [phone] Name: RPriscilla KirsteinMRN: [id number] of Birth: [birth year] PHYSICAL THERAPY DISCHARGE SUMMARY  Visits from Start of Care: 9  Current functional level related to goals / functional outcomes: See above   Remaining deficits: See above   Education /  Equipment: HEP  Plan: Patient agrees to discharge.  Patient goals were not met. Patient is being discharged due to not returning since the last visit.  ?????         Patient missed appointments due to weather and is leaving to go back to her country.  Zannie Cove, PT 11/20/17 7:38 AM

## 2017-11-13 ENCOUNTER — Encounter: Payer: No Typology Code available for payment source | Admitting: Physical Therapy

## 2017-11-14 ENCOUNTER — Ambulatory Visit: Payer: 59 | Admitting: Physical Therapy

## 2017-11-14 ENCOUNTER — Telehealth: Payer: Self-pay | Admitting: Physical Therapy

## 2017-11-14 NOTE — Telephone Encounter (Signed)
Pt called and informed today was last scheduled appointment.  Pt was no show.  She was given number to call in order to reschedule  Vincente PoliJakki Diaz, PT 11/14/17 3:57 PM

## 2018-02-03 ENCOUNTER — Other Ambulatory Visit: Payer: Self-pay | Admitting: Physician Assistant

## 2018-03-07 ENCOUNTER — Encounter: Payer: Self-pay | Admitting: Physician Assistant

## 2018-04-04 IMAGING — DX DG HIP (WITH OR WITHOUT PELVIS) 2-3V*L*
3 series · 3 of 3 positions shown · non-contrast
Comparison: No priors.

CLINICAL DATA: 31-year-old female with history of left-sided hip
pain.

EXAM:
DG HIP (WITH OR WITHOUT PELVIS) 2-3V LEFT

[pelvis ap]
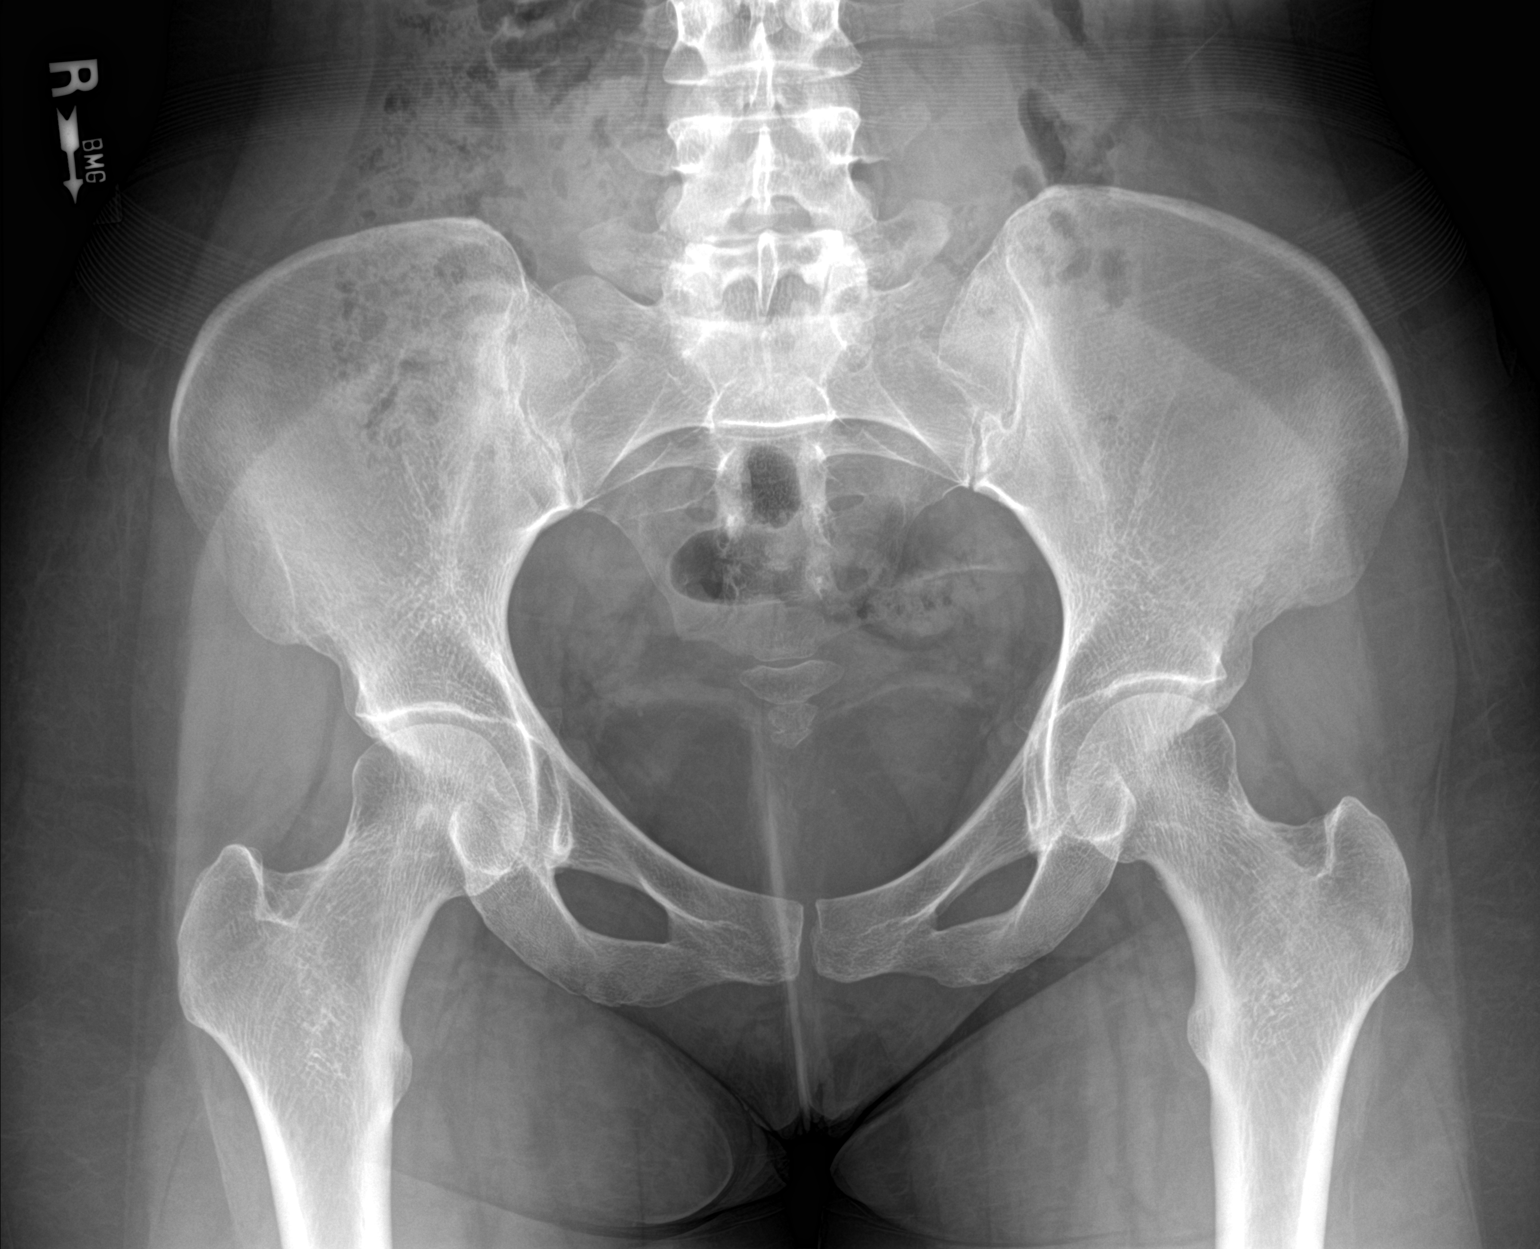

[hip ap]
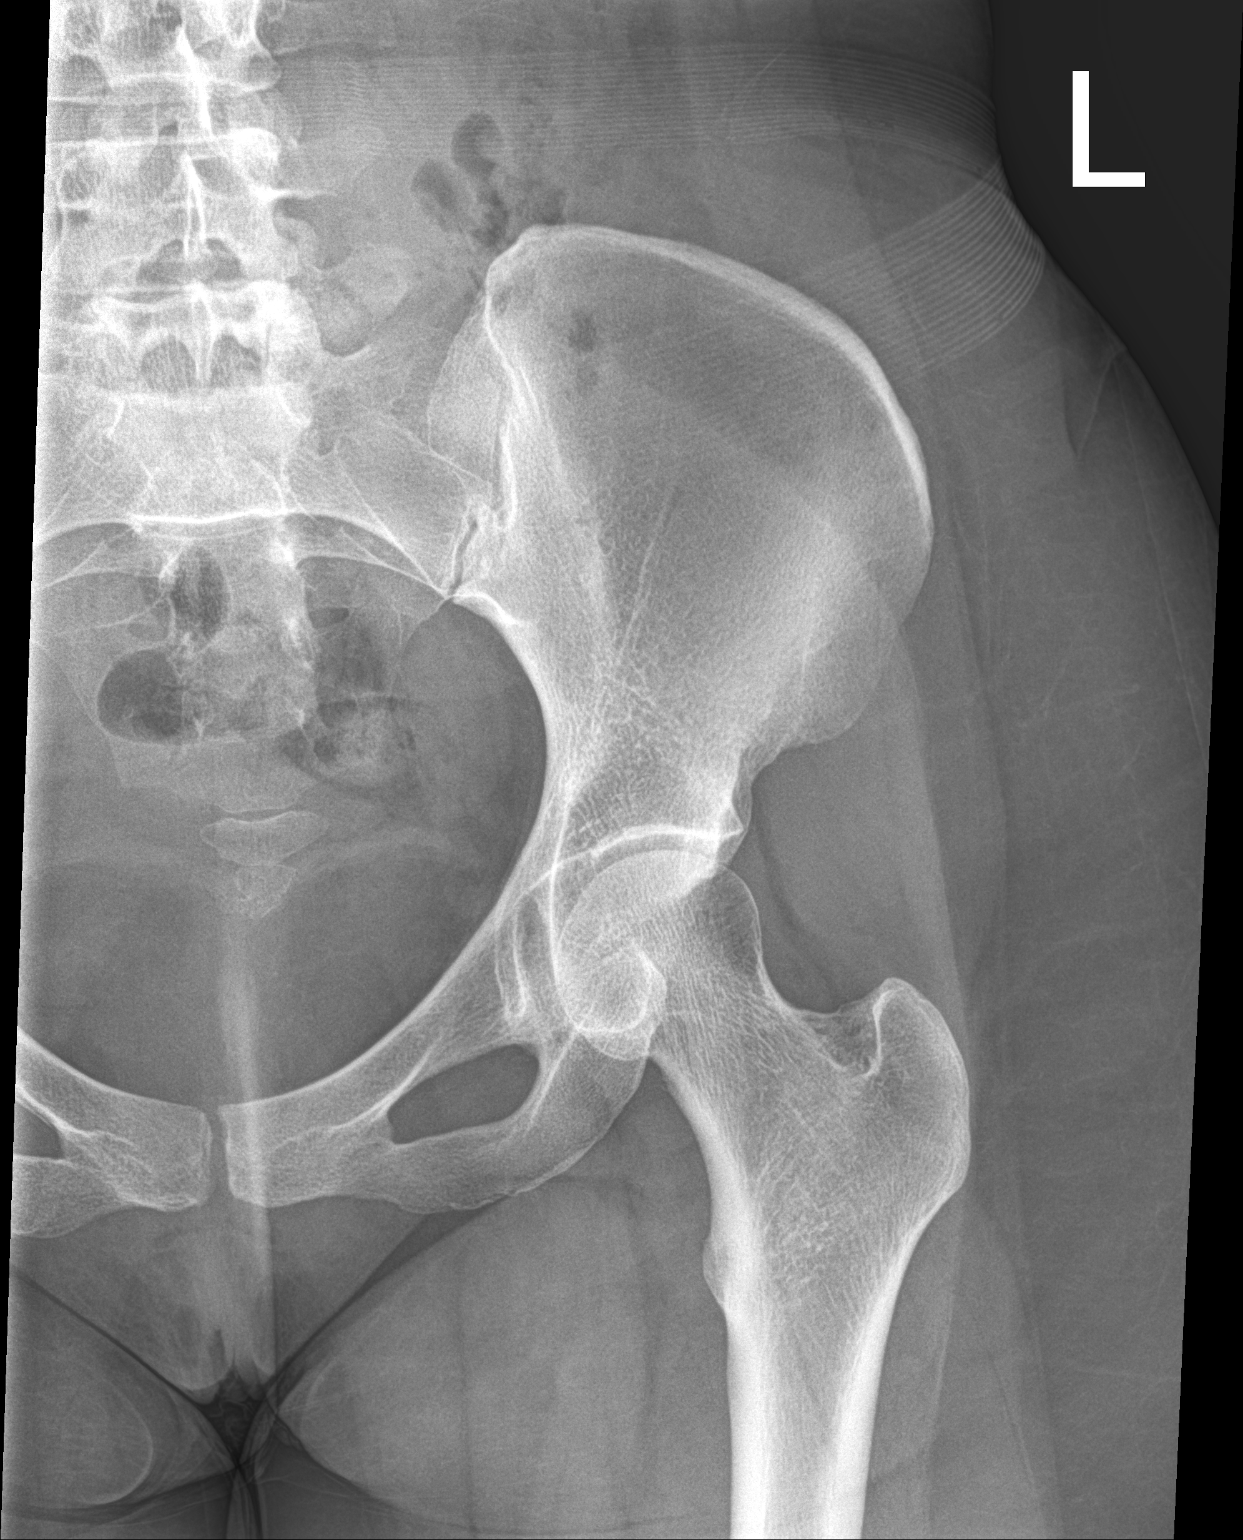

[hip lat]
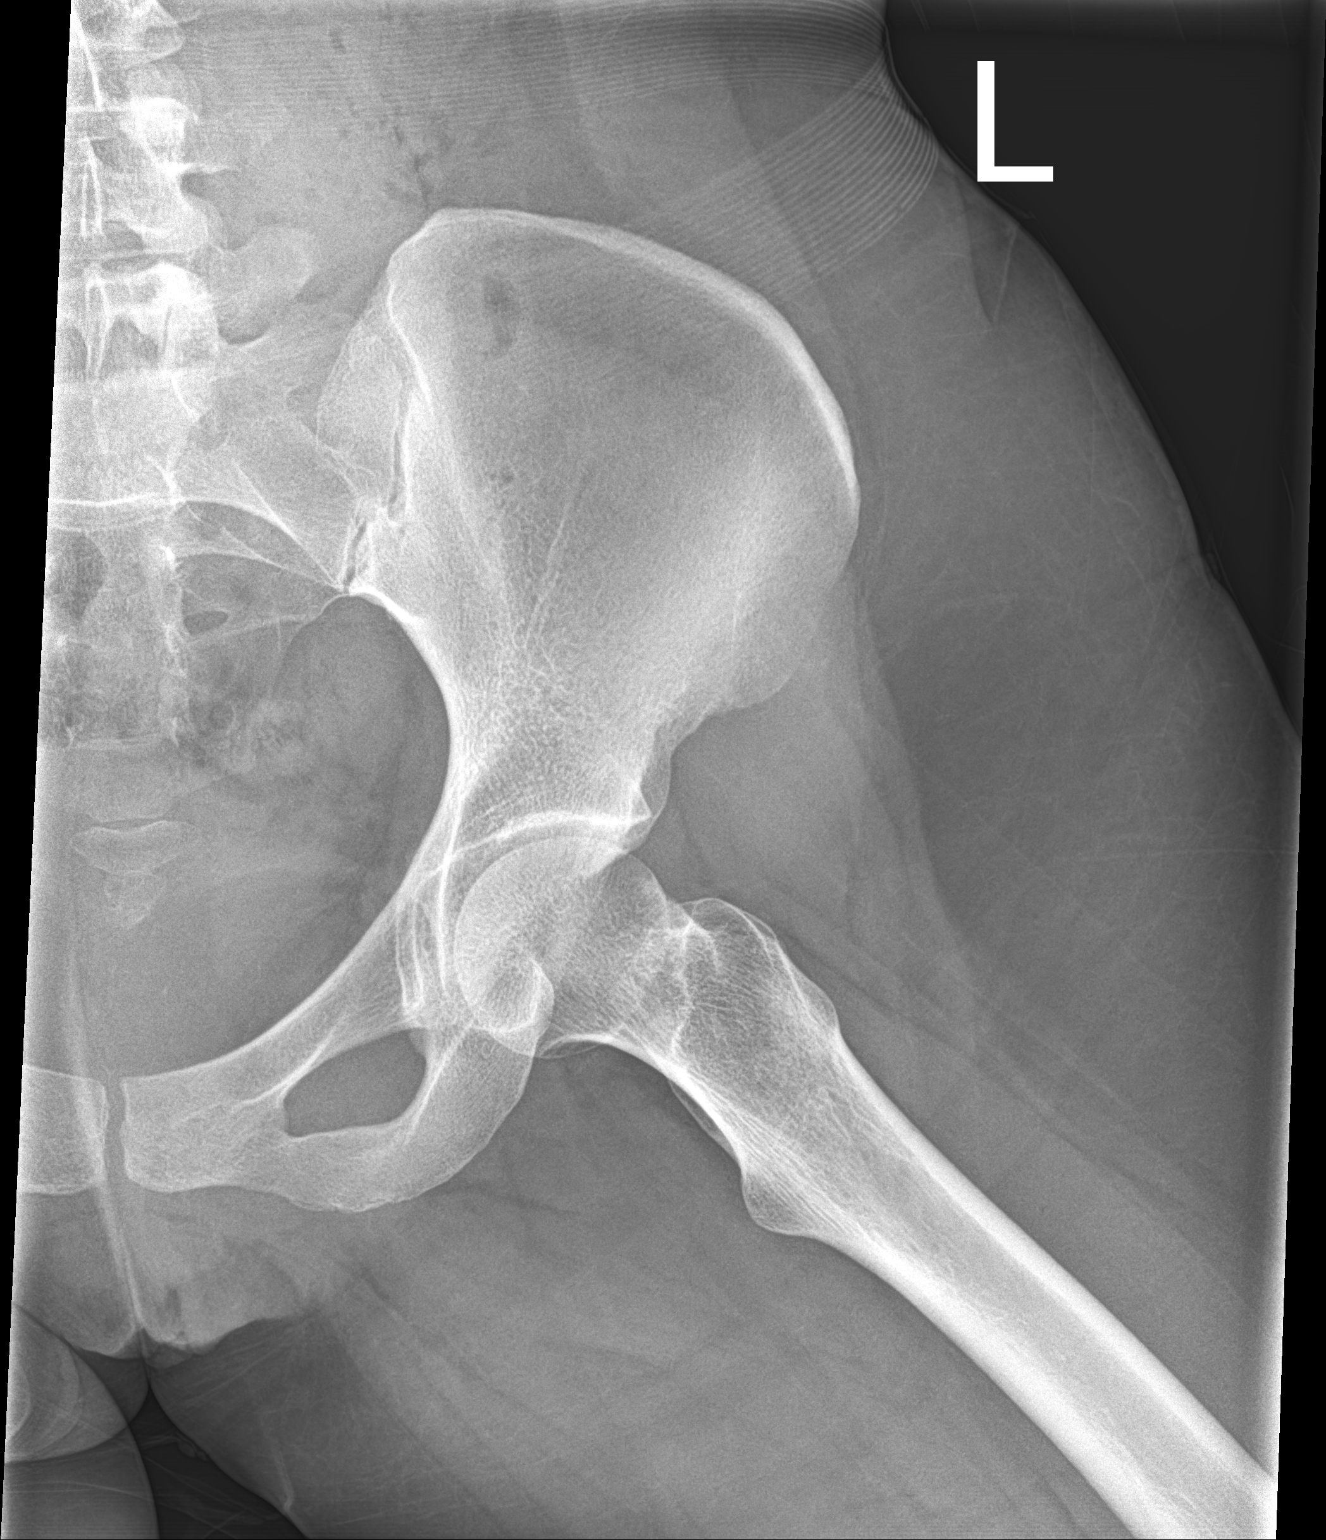

[3 of 3 positions shown; findings below may reference images not displayed]

FINDINGS: There is no evidence of hip fracture or dislocation. There is no
evidence of arthropathy or other focal bone abnormality.
IMPRESSION: Negative.

## 2018-04-20 ENCOUNTER — Encounter: Payer: Self-pay | Admitting: Family Medicine

## 2018-04-25 ENCOUNTER — Encounter: Payer: Self-pay | Admitting: Family Medicine

## 2020-12-05 NOTE — L&D Delivery Note (Signed)
Delivery Note At 4:44 PM a viable female was delivered via  (Presentation:   LOA   ).  APGAR: ,per NICU-  ; weight  pending.   Placenta status: spont, intact with adherent clot c/w abruption ,  .  Cord:   3vc, nuchal x 1 reduced at perineum. with the following complications:none  .  Cord pH pending  Increased vaginal bleeding prior to delivery  Anesthesia: Epidural Episiotomy:  none Lacerations:  none Suture Repair:  n/a Est. Blood Loss (mL):  150  Mom to postpartum.  Baby to  being eval by NICU. SGA .  Lendon Colonel 07/28/2021, 4:55 PM

## 2021-01-19 LAB — OB RESULTS CONSOLE GC/CHLAMYDIA
Chlamydia: NEGATIVE
Gonorrhea: NEGATIVE

## 2021-01-19 LAB — OB RESULTS CONSOLE HEPATITIS B SURFACE ANTIGEN: Hepatitis B Surface Ag: NEGATIVE

## 2021-01-19 LAB — OB RESULTS CONSOLE HIV ANTIBODY (ROUTINE TESTING): HIV: NONREACTIVE

## 2021-06-16 ENCOUNTER — Other Ambulatory Visit: Payer: Self-pay | Admitting: Obstetrics

## 2021-06-16 DIAGNOSIS — Z363 Encounter for antenatal screening for malformations: Secondary | ICD-10-CM

## 2021-06-16 DIAGNOSIS — Z3A30 30 weeks gestation of pregnancy: Secondary | ICD-10-CM

## 2021-06-17 ENCOUNTER — Encounter: Payer: Self-pay | Admitting: *Deleted

## 2021-06-18 ENCOUNTER — Encounter: Payer: Self-pay | Admitting: *Deleted

## 2021-06-18 ENCOUNTER — Other Ambulatory Visit: Payer: Self-pay | Admitting: *Deleted

## 2021-06-18 ENCOUNTER — Other Ambulatory Visit: Payer: Self-pay | Admitting: Obstetrics

## 2021-06-18 ENCOUNTER — Other Ambulatory Visit: Payer: Self-pay

## 2021-06-18 ENCOUNTER — Ambulatory Visit: Payer: PPO | Admitting: *Deleted

## 2021-06-18 ENCOUNTER — Ambulatory Visit: Payer: PPO | Attending: Obstetrics

## 2021-06-18 ENCOUNTER — Ambulatory Visit
Payer: No Typology Code available for payment source | Attending: Obstetrics and Gynecology | Admitting: Obstetrics and Gynecology

## 2021-06-18 VITALS — BP 106/61 | HR 77

## 2021-06-18 DIAGNOSIS — O365921 Maternal care for other known or suspected poor fetal growth, second trimester, fetus 1: Secondary | ICD-10-CM

## 2021-06-18 DIAGNOSIS — Z3A31 31 weeks gestation of pregnancy: Secondary | ICD-10-CM

## 2021-06-18 DIAGNOSIS — Z363 Encounter for antenatal screening for malformations: Secondary | ICD-10-CM

## 2021-06-18 DIAGNOSIS — O43193 Other malformation of placenta, third trimester: Secondary | ICD-10-CM

## 2021-06-18 DIAGNOSIS — O365930 Maternal care for other known or suspected poor fetal growth, third trimester, not applicable or unspecified: Secondary | ICD-10-CM

## 2021-06-18 DIAGNOSIS — Z3A30 30 weeks gestation of pregnancy: Secondary | ICD-10-CM

## 2021-06-18 DIAGNOSIS — O365990 Maternal care for other known or suspected poor fetal growth, unspecified trimester, not applicable or unspecified: Secondary | ICD-10-CM

## 2021-06-18 NOTE — Progress Notes (Signed)
Maternal-Fetal Medicine   Name: Selena Diaz DOB: 12-12-1985 MRN: [id number] Referring Provider: Noland Fordyce, MD  I had the pleasure of seeing [facility] at [facility]. She is G3 P2 at 31w 3d gestation and is here for a second opinion. At your office ultrasound, fetal growth restriction was detected.  Her prenatal course has been, otherwise, uneventful.  Her pregnancy is well dated by LMP date that is consistent with 7-week ultrasound.  Her blood pressures have been normal at prenatal visits.  She does not have gestational diabetes.  On cell free fetal DNA screening, she had low risk for fetal aneuploidies.  Past medical history: No history of diabetes or hypertension or any chronic medical conditions. Past surgical history: Nil of note. Allergies: No known drug allergies. Social history: Denies tobacco or drug or alcohol use.  She has been married 3 years and her husband is in good health.  Obstetric history -02/2019: Term vaginal delivery of a female infant weighing 2,400 g at birth.  Her pregnancy and delivery were uncomplicated. -03/2020: Delivery at [redacted] weeks gestation over female infant weighing 2,500 g at birth her pregnancy and delivery were uncomplicated.  Blood pressure today at her office is 106/61 mmHg. Ultrasound On today's ultrasound, the estimated fetal weight is at the 1st percentile.  Abdominal circumference and femur length measurements are at the 1st percentiles.  Amniotic fluid is normal and good fetal activity seen.  Fetal anatomical survey appears normal but limited by advanced gestational age.  Umbilical artery Doppler showed normal forward diastolic flow.  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  Our concerns include: Severe fetal growth restriction -I counseled the patient on the finding of severe fetal growth restriction as defined by the estimated fetal weight less than the 3rd percentile. -Her previous children were 2500 g or less and  they are both in good health. -I informed her that it is difficult to differentiate a constitutionally small fetus from severe fetal growth restriction.  However, we recommend weekly antenatal testing till delivery. -I discussed the possible causes of fetal growth restriction including placental insufficiency (most likely cause) and chromosomal anomalies. -I discussed the significance and limitations of cell free fetal DNA screening that he does not give information on all the chromosomes. -I discussed marginal cord insertion with help of diagrams.  I informed her that in some cases, marginal cord insertion can be associated with fetal growth restriction. -Patient had questions about antenatal corticosteroids.  Since antenatal testing is reassuring, we recommend that antenatal corticosteroids may not be given now. -She had questions on timing of delivery.  If severe fetal growth restriction persists, we recommend delivery at [redacted] weeks gestation.  If abnormal Doppler studies or seen or poor interval growth seen earlier delivery may be indicated.  Recommendations -Weekly BPP and UA Doppler studies at your office as scheduled. -An appointment was made for her to return in 3 weeks for fetal growth assessment, UA Doppler, BPP and NST. -Defer antenatal corticosteroids. -Timing of delivery to be decided after next fetal growth assessment.  Thank you for consultation.  If you have any questions or concerns, please contact me the [facility].  Consultation including face-to-face (more than 50%) counseling 30 minutes.

## 2021-07-09 ENCOUNTER — Ambulatory Visit: Payer: PPO

## 2021-07-09 ENCOUNTER — Other Ambulatory Visit: Payer: Self-pay | Admitting: *Deleted

## 2021-07-09 ENCOUNTER — Encounter: Payer: Self-pay | Admitting: *Deleted

## 2021-07-09 ENCOUNTER — Ambulatory Visit: Payer: PPO | Admitting: *Deleted

## 2021-07-09 ENCOUNTER — Other Ambulatory Visit: Payer: Self-pay

## 2021-07-09 ENCOUNTER — Ambulatory Visit: Payer: PPO | Attending: Obstetrics and Gynecology

## 2021-07-09 VITALS — BP 99/57 | HR 83

## 2021-07-09 DIAGNOSIS — Z363 Encounter for antenatal screening for malformations: Secondary | ICD-10-CM | POA: Diagnosis not present

## 2021-07-09 DIAGNOSIS — O365930 Maternal care for other known or suspected poor fetal growth, third trimester, not applicable or unspecified: Secondary | ICD-10-CM

## 2021-07-09 DIAGNOSIS — O43193 Other malformation of placenta, third trimester: Secondary | ICD-10-CM

## 2021-07-09 DIAGNOSIS — O365990 Maternal care for other known or suspected poor fetal growth, unspecified trimester, not applicable or unspecified: Secondary | ICD-10-CM | POA: Diagnosis not present

## 2021-07-09 DIAGNOSIS — Z3A34 34 weeks gestation of pregnancy: Secondary | ICD-10-CM

## 2021-07-12 ENCOUNTER — Telehealth: Payer: Self-pay

## 2021-07-12 NOTE — Telephone Encounter (Signed)
mar/lm to advise pt of appt information for 07/22/21-arrive@130p .

## 2021-07-14 ENCOUNTER — Telehealth: Payer: Self-pay

## 2021-07-14 NOTE — Telephone Encounter (Signed)
mar/lm to advise patient of 07/23/21 appt information.

## 2021-07-16 ENCOUNTER — Other Ambulatory Visit: Payer: Self-pay | Admitting: Obstetrics

## 2021-07-16 ENCOUNTER — Other Ambulatory Visit: Payer: Self-pay

## 2021-07-16 ENCOUNTER — Ambulatory Visit: Payer: PPO | Attending: Obstetrics

## 2021-07-16 ENCOUNTER — Ambulatory Visit: Payer: PPO | Admitting: *Deleted

## 2021-07-16 ENCOUNTER — Encounter: Payer: Self-pay | Admitting: *Deleted

## 2021-07-16 VITALS — BP 104/60 | HR 72

## 2021-07-16 DIAGNOSIS — O365931 Maternal care for other known or suspected poor fetal growth, third trimester, fetus 1: Secondary | ICD-10-CM

## 2021-07-16 DIAGNOSIS — O365930 Maternal care for other known or suspected poor fetal growth, third trimester, not applicable or unspecified: Secondary | ICD-10-CM

## 2021-07-16 NOTE — Procedures (Signed)
Selena Diaz 1986/08/17 [redacted]w[redacted]d  Fetus A Non-Stress Test Interpretation for 07/16/21  Indication: IUGR  Fetal Heart Rate A Mode: External Baseline Rate (A): 125 bpm Variability: Moderate Accelerations: 15 x 15 Decelerations: None Multiple birth?: No  Uterine Activity Mode: Palpation, Toco Contraction Frequency (min): none Resting Tone Palpated: Relaxed  Interpretation (Fetal Testing) Nonstress Test Interpretation: Reactive Overall Impression: Reassuring for gestational age Comments: Dr. Judeth Cornfield reviewed tracing

## 2021-07-20 ENCOUNTER — Telehealth (HOSPITAL_COMMUNITY): Payer: Self-pay | Admitting: *Deleted

## 2021-07-20 NOTE — Telephone Encounter (Signed)
Preadmission screen  

## 2021-07-22 ENCOUNTER — Other Ambulatory Visit: Payer: No Typology Code available for payment source

## 2021-07-22 ENCOUNTER — Telehealth (HOSPITAL_COMMUNITY): Payer: Self-pay | Admitting: *Deleted

## 2021-07-22 ENCOUNTER — Ambulatory Visit: Payer: No Typology Code available for payment source

## 2021-07-22 NOTE — Telephone Encounter (Signed)
Preadmission screen  

## 2021-07-23 ENCOUNTER — Telehealth (HOSPITAL_COMMUNITY): Payer: Self-pay | Admitting: *Deleted

## 2021-07-23 ENCOUNTER — Other Ambulatory Visit: Payer: No Typology Code available for payment source

## 2021-07-23 ENCOUNTER — Ambulatory Visit: Payer: PPO

## 2021-07-23 ENCOUNTER — Encounter (HOSPITAL_COMMUNITY): Payer: Self-pay | Admitting: *Deleted

## 2021-07-23 ENCOUNTER — Other Ambulatory Visit: Payer: Self-pay | Admitting: Obstetrics

## 2021-07-23 NOTE — Telephone Encounter (Signed)
Preadmission screen  

## 2021-07-26 ENCOUNTER — Other Ambulatory Visit: Payer: Self-pay | Admitting: Obstetrics

## 2021-07-27 LAB — SARS CORONAVIRUS 2 (TAT 6-24 HRS): SARS Coronavirus 2: NEGATIVE

## 2021-07-27 NOTE — H&P (Signed)
Selena Diaz is a 35 y.o. G3P2002 at [redacted]w[redacted]d presenting for induction of labor. Pt notes rare contractions  . Good fetal movement, No vaginal bleeding, no leaking fluid.  Induction of labor at 37 weeks for severe IUGR at the recommendation of maternal-fetal medicine.  Patient has done betamethasone.  [facility] at [facility] since 7 wks -Dated by LMP consistent with 7-week ultrasound IUGR.  First noticed at 30 weeks with fetal growth at the 0 percentile patient's been followed by MFM and with weekly fetal testing.  Patient has remained with normal SD ratios, normal BPP and adequate AFI.  Last growth at 34 weeks baby 183 0 g, 4 pounds 1 ounces at the 2nd percentile.  Unclear etiology of growth restriction.  Patient did have little weight gain with only 10 pound weight gain in the pregnancy.  She did have a 1 month religious fast in the beginning of the pregnancy and has not been doing prenatal vitamins.  She is not anemic and she is not diabetic   Prenatal Transfer Tool  Maternal Diabetes: No Genetic Screening: Normal Maternal Ultrasounds/Referrals: IUGR Fetal Ultrasounds or other Referrals:  Referred to Materal Fetal Medicine  Maternal Substance Abuse:  No Significant Maternal Medications:  None Significant Maternal Lab Results: Group B Strep negative     OB History     Gravida  3   Para  2   Term  2   Preterm      AB      Living  2      SAB      IAB      Ectopic      Multiple      Live Births  2          Past Medical History:  Diagnosis Date   Medical history non-contributory    Past Surgical History:  Procedure Laterality Date   MOUTH SURGERY     Family History: family history includes Heart disease in her mother; Thyroid disease in her mother. Social History:  reports that she has never smoked. She has never used smokeless tobacco. She reports that she does not drink alcohol and does not use drugs.  Review of Systems - Negative except discomfort of the  pregnancy     Last menstrual period 11/10/2020.  Physical Exam:  Gen: well appearing, no distress CV: RRR Pulm: CTAB Back: no CVAT Abd: gravid, NT, no RUQ pain LE: No edema, equal bilaterally, non-tender  Prenatal labs: ABO, Rh: A positive Antibody: Negative Rubella: Immune RPR:   Nonreactive HBsAg: Negative (02/15 0000)  HIV: Non-reactive (02/15 0000)  GBS:   Negative 1 hr Glucola 91  Genetic screening normal panorama Anatomy US normal   Assessment/Plan: 35 y.o. G3P2002 at [redacted]w[redacted]d Severe IUGR with measurements over the last 7 weeks between 0 and 2nd percentile.  Normal AFI, BPP and SD ratios.  Unclear if this is constitutionally small but given the severity are moving to induction of labor.  Patient with vaginal delivery x2.  We will plan Pitocin 2 x 2.  Possible cervical Foley. GBS negative   Lendon Colonel 07/27/2021, 5:35 PM

## 2021-07-28 ENCOUNTER — Inpatient Hospital Stay (HOSPITAL_COMMUNITY): Payer: PPO | Admitting: Anesthesiology

## 2021-07-28 ENCOUNTER — Inpatient Hospital Stay (HOSPITAL_COMMUNITY): Admission: AD | Admit: 2021-07-28 | Payer: PPO | Source: Home / Self Care | Admitting: Obstetrics

## 2021-07-28 ENCOUNTER — Inpatient Hospital Stay (HOSPITAL_COMMUNITY): Payer: PPO

## 2021-07-28 ENCOUNTER — Encounter (HOSPITAL_COMMUNITY): Payer: Self-pay | Admitting: Obstetrics and Gynecology

## 2021-07-28 ENCOUNTER — Other Ambulatory Visit: Payer: Self-pay

## 2021-07-28 ENCOUNTER — Inpatient Hospital Stay (HOSPITAL_COMMUNITY)
Admission: AD | Admit: 2021-07-28 | Discharge: 2021-07-30 | DRG: 805 | Disposition: A | Discharge status: Home / Self Care | Payer: PPO | Attending: Obstetrics | Admitting: Obstetrics

## 2021-07-28 DIAGNOSIS — Z3A37 37 weeks gestation of pregnancy: Secondary | ICD-10-CM | POA: Diagnosis not present

## 2021-07-28 DIAGNOSIS — O365930 Maternal care for other known or suspected poor fetal growth, third trimester, not applicable or unspecified: Secondary | ICD-10-CM | POA: Diagnosis present

## 2021-07-28 DIAGNOSIS — O4593 Premature separation of placenta, unspecified, third trimester: Secondary | ICD-10-CM | POA: Diagnosis present

## 2021-07-28 DIAGNOSIS — Z3490 Encounter for supervision of normal pregnancy, unspecified, unspecified trimester: Secondary | ICD-10-CM | POA: Diagnosis present

## 2021-07-28 LAB — CBC
HCT: 35.5 % — ABNORMAL LOW (ref 36.0–46.0)
Hemoglobin: 12.4 g/dL (ref 12.0–15.0)
MCH: 32.6 pg (ref 26.0–34.0)
MCHC: 34.9 g/dL (ref 30.0–36.0)
MCV: 93.4 fL (ref 80.0–100.0)
Platelets: 196 10*3/uL (ref 150–400)
RBC: 3.8 MIL/uL — ABNORMAL LOW (ref 3.87–5.11)
RDW: 12.7 % (ref 11.5–15.5)
WBC: 11.2 10*3/uL — ABNORMAL HIGH (ref 4.0–10.5)
nRBC: 0 % (ref 0.0–0.2)

## 2021-07-28 LAB — TYPE AND SCREEN
ABO/RH(D): A POS
Antibody Screen: NEGATIVE

## 2021-07-28 LAB — RPR: RPR Ser Ql: NONREACTIVE

## 2021-07-28 MED ORDER — SIMETHICONE 80 MG PO CHEW: 80.0000 mg | CHEWABLE_TABLET | ORAL | Status: DC | PRN

## 2021-07-28 MED ORDER — ONDANSETRON HCL 4 MG/2ML IJ SOLN: 4.0000 mg | Freq: Four times a day (QID) | INTRAMUSCULAR | Status: DC | PRN

## 2021-07-28 MED ORDER — FENTANYL-BUPIVACAINE-NACL 0.5-0.125-0.9 MG/250ML-% EP SOLN
12.0000 mL/h | EPIDURAL | Status: DC | PRN
  Administered 2021-07-28: 12 mL/h via EPIDURAL
  Filled 2021-07-28: qty 250

## 2021-07-28 MED ORDER — PHENYLEPHRINE 40 MCG/ML (10ML) SYRINGE FOR IV PUSH (FOR BLOOD PRESSURE SUPPORT)
80.0000 ug | PREFILLED_SYRINGE | INTRAVENOUS | Status: DC | PRN
  Filled 2021-07-28: qty 10

## 2021-07-28 MED ORDER — EPHEDRINE 5 MG/ML INJ: 10.0000 mg | INTRAVENOUS | Status: DC | PRN

## 2021-07-28 MED ORDER — ONDANSETRON HCL 4 MG PO TABS: 4.0000 mg | ORAL_TABLET | ORAL | Status: DC | PRN

## 2021-07-28 MED ORDER — OXYCODONE HCL 5 MG PO TABS
10.0000 mg | ORAL_TABLET | ORAL | Status: DC | PRN
Start: 2021-07-28 — End: 2021-07-30

## 2021-07-28 MED ORDER — LACTATED RINGERS IV SOLN: 500.0000 mL | Freq: Once | INTRAVENOUS | Status: DC

## 2021-07-28 MED ORDER — ACETAMINOPHEN 325 MG PO TABS: 650.0000 mg | ORAL_TABLET | ORAL | Status: DC | PRN

## 2021-07-28 MED ORDER — SOD CITRATE-CITRIC ACID 500-334 MG/5ML PO SOLN: 30.0000 mL | ORAL | Status: DC | PRN

## 2021-07-28 MED ORDER — IBUPROFEN 600 MG PO TABS
600.0000 mg | ORAL_TABLET | Freq: Four times a day (QID) | ORAL | Status: DC
  Administered 2021-07-28 – 2021-07-30 (×4): 600 mg via ORAL
  Filled 2021-07-28 (×7): qty 1

## 2021-07-28 MED ORDER — PRENATAL MULTIVITAMIN CH
1.0000 | ORAL_TABLET | Freq: Every day | ORAL | Status: DC
  Administered 2021-07-29 – 2021-07-30 (×2): 1 via ORAL
  Filled 2021-07-28 (×2): qty 1

## 2021-07-28 MED ORDER — DIPHENHYDRAMINE HCL 25 MG PO CAPS: 25.0000 mg | ORAL_CAPSULE | Freq: Four times a day (QID) | ORAL | Status: DC | PRN

## 2021-07-28 MED ORDER — DIPHENHYDRAMINE HCL 50 MG/ML IJ SOLN: 12.5000 mg | INTRAMUSCULAR | Status: DC | PRN

## 2021-07-28 MED ORDER — FENTANYL CITRATE (PF) 100 MCG/2ML IJ SOLN
INTRAMUSCULAR | Status: AC
  Filled 2021-07-28: qty 2

## 2021-07-28 MED ORDER — LACTATED RINGERS IV SOLN
500.0000 mL | INTRAVENOUS | Status: DC | PRN
  Administered 2021-07-28 (×2): 1000 mL via INTRAVENOUS

## 2021-07-28 MED ORDER — OXYTOCIN-SODIUM CHLORIDE 30-0.9 UT/500ML-% IV SOLN: 2.5000 [IU]/h | INTRAVENOUS | Status: DC

## 2021-07-28 MED ORDER — FENTANYL-BUPIVACAINE-NACL 0.5-0.125-0.9 MG/250ML-% EP SOLN: 12.0000 mL/h | EPIDURAL | Status: DC | PRN

## 2021-07-28 MED ORDER — PHENYLEPHRINE 40 MCG/ML (10ML) SYRINGE FOR IV PUSH (FOR BLOOD PRESSURE SUPPORT)
80.0000 ug | PREFILLED_SYRINGE | INTRAVENOUS | Status: AC | PRN
  Administered 2021-07-28 (×3): 80 ug via INTRAVENOUS

## 2021-07-28 MED ORDER — DIBUCAINE (PERIANAL) 1 % EX OINT: 1.0000 "application " | TOPICAL_OINTMENT | CUTANEOUS | Status: DC | PRN

## 2021-07-28 MED ORDER — LIDOCAINE HCL (PF) 1 % IJ SOLN: 30.0000 mL | INTRAMUSCULAR | Status: DC | PRN

## 2021-07-28 MED ORDER — FENTANYL CITRATE (PF) 100 MCG/2ML IJ SOLN
50.0000 ug | Freq: Once | INTRAMUSCULAR | Status: AC
  Administered 2021-07-28: 50 ug via INTRAVENOUS

## 2021-07-28 MED ORDER — ZOLPIDEM TARTRATE 5 MG PO TABS: 5.0000 mg | ORAL_TABLET | Freq: Every evening | ORAL | Status: DC | PRN

## 2021-07-28 MED ORDER — SENNOSIDES-DOCUSATE SODIUM 8.6-50 MG PO TABS
2.0000 | ORAL_TABLET | ORAL | Status: DC
  Administered 2021-07-29 – 2021-07-30 (×2): 2 via ORAL
  Filled 2021-07-28 (×2): qty 2

## 2021-07-28 MED ORDER — OXYTOCIN BOLUS FROM INFUSION
333.0000 mL | Freq: Once | INTRAVENOUS | Status: AC
  Administered 2021-07-28: 333 mL via INTRAVENOUS

## 2021-07-28 MED ORDER — TETANUS-DIPHTH-ACELL PERTUSSIS 5-2.5-18.5 LF-MCG/0.5 IM SUSY: 0.5000 mL | PREFILLED_SYRINGE | Freq: Once | INTRAMUSCULAR | Status: DC

## 2021-07-28 MED ORDER — COCONUT OIL OIL: 1.0000 "application " | TOPICAL_OIL | Status: DC | PRN

## 2021-07-28 MED ORDER — WITCH HAZEL-GLYCERIN EX PADS: 1.0000 "application " | MEDICATED_PAD | CUTANEOUS | Status: DC | PRN

## 2021-07-28 MED ORDER — ONDANSETRON HCL 4 MG/2ML IJ SOLN: 4.0000 mg | INTRAMUSCULAR | Status: DC | PRN

## 2021-07-28 MED ORDER — LACTATED RINGERS IV SOLN: INTRAVENOUS | Status: DC

## 2021-07-28 MED ORDER — PHENYLEPHRINE 40 MCG/ML (10ML) SYRINGE FOR IV PUSH (FOR BLOOD PRESSURE SUPPORT): 80.0000 ug | PREFILLED_SYRINGE | INTRAVENOUS | Status: DC | PRN

## 2021-07-28 MED ORDER — TERBUTALINE SULFATE 1 MG/ML IJ SOLN: 0.2500 mg | Freq: Once | INTRAMUSCULAR | Status: DC | PRN

## 2021-07-28 MED ORDER — LIDOCAINE-EPINEPHRINE (PF) 2 %-1:200000 IJ SOLN
INTRAMUSCULAR | Status: DC | PRN
Start: 2021-07-28 — End: 2021-07-28
  Administered 2021-07-28: 5 mL via EPIDURAL

## 2021-07-28 MED ORDER — OXYCODONE HCL 5 MG PO TABS: 5.0000 mg | ORAL_TABLET | ORAL | Status: DC | PRN

## 2021-07-28 MED ORDER — BENZOCAINE-MENTHOL 20-0.5 % EX AERO: 1.0000 "application " | INHALATION_SPRAY | CUTANEOUS | Status: DC | PRN

## 2021-07-28 MED ORDER — OXYTOCIN-SODIUM CHLORIDE 30-0.9 UT/500ML-% IV SOLN
1.0000 m[IU]/min | INTRAVENOUS | Status: DC
  Administered 2021-07-28: 2 m[IU]/min via INTRAVENOUS
  Filled 2021-07-28: qty 500

## 2021-07-28 NOTE — Anesthesia Procedure Notes (Signed)
Epidural Patient location during procedure: OB Start time: 07/28/2021 3:16 PM End time: 07/28/2021 3:22 PM  Staffing Anesthesiologist: Shelton Silvas, MD Performed: anesthesiologist   Preanesthetic Checklist Completed: patient identified, IV checked, site marked, risks and benefits discussed, surgical consent, monitors and equipment checked, pre-op evaluation and timeout performed  Epidural Patient position: sitting Prep: DuraPrep Patient monitoring: heart rate, continuous pulse ox and blood pressure Approach: midline Location: L3-L4 Injection technique: LOR saline  Needle:  Needle type: Tuohy  Needle gauge: 17 G Needle length: 9 cm Catheter type: closed end flexible Catheter size: 20 Guage Test dose: negative and 1.5% lidocaine  Assessment Events: blood not aspirated, injection not painful, no injection resistance and no paresthesia  Additional Notes LOR @ 5  Patient identified. Risks/Benefits/Options discussed with patient including but not limited to bleeding, infection, nerve damage, paralysis, failed block, incomplete pain control, headache, blood pressure changes, nausea, vomiting, reactions to medications, itching and postpartum back pain. Confirmed with bedside nurse the patient's most recent platelet count. Confirmed with patient that they are not currently taking any anticoagulation, have any bleeding history or any family history of bleeding disorders. Patient expressed understanding and wished to proceed. All questions were answered. Sterile technique was used throughout the entire procedure. Please see nursing notes for vital signs. Test dose was given through epidural catheter and negative prior to continuing to dose epidural or start infusion. Warning signs of high block given to the patient including shortness of breath, tingling/numbness in hands, complete motor block, or any concerning symptoms with instructions to call for help. Patient was given instructions on  fall risk and not to get out of bed. All questions and concerns addressed with instructions to call with any issues or inadequate analgesia.    Reason for block:procedure for pain

## 2021-07-28 NOTE — H&P (Signed)
Selena Diaz is a 35 y.o. G3P2002 at [redacted]w[redacted]d presenting for contractions on the day she was planned for induction of labor.  . Good fetal movement, No vaginal bleeding, no leaking fluid.  Induction of labor at 37 weeks for severe IUGR at the recommendation of maternal-fetal medicine.  Patient has done betamethasone.   P[facility]are at [facility] since 7 wks -Dated by LMP consistent with 7-week ultrasound IUGR.  First noticed at 30 weeks with fetal growth at the 0 percentile patient's been followed by MFM and with weekly fetal testing.  Patient has remained with normal SD ratios, normal BPP and adequate AFI.  Last growth at 34 weeks baby 183 0 g, 4 pounds 1 ounces at the 2nd percentile.  Unclear etiology of growth restriction.  Patient did have little weight gain with only 10 pound weight gain in the pregnancy.  She did have a 1 month religious fast in the beginning of the pregnancy and has not been doing prenatal vitamins.  She is not anemic and she is not diabetic     Prenatal Transfer Tool  Maternal Diabetes: No Genetic Screening: Normal Maternal Ultrasounds/Referrals: IUGR Fetal Ultrasounds or other Referrals:  Referred to Materal Fetal Medicine  Maternal Substance Abuse:  No Significant Maternal Medications:  None Significant Maternal Lab Results: Group B Strep negative         OB History       Gravida  3   Para  2   Term  2   Preterm      AB      Living  2        SAB      IAB      Ectopic      Multiple      Live Births  2                 Past Medical History:  Diagnosis Date   Medical history non-contributory           Past Surgical History:  Procedure Laterality Date   MOUTH SURGERY        Family History: family history includes Heart disease in her mother; Thyroid disease in her mother. Social History:  reports that she has never smoked. She has never used smokeless tobacco. She reports that she does not drink alcohol and does not use drugs.   Review  of Systems - Negative except discomfort of the pregnancy     Last menstrual period 11/10/2020.   Physical Exam:  Today's Vitals   07/28/21 0729 07/28/21 0730 07/28/21 0748 07/28/21 0755  BP:  108/71 112/64   Pulse:  78 81   Resp:  16    Temp:  97.9 F (36.6 C)    TempSrc:  Oral    SpO2:  99%  99%  Weight:      Height:      PainSc: 6       Body mass index is 29.39 kg/m.  Toco: Q. 5-8 FH: baseline 120s, accelerations present, early deceleratons with most contractions, 10 beat variability   Prenatal labs: ABO, Rh: A positive Antibody: Negative Rubella: Immune RPR:   Nonreactive HBsAg: Negative (02/15 0000)  HIV: Non-reactive (02/15 0000)  GBS:   Negative 1 hr Glucola 91            Genetic screening normal panorama Anatomy US normal     Assessment/Plan: 35 y.o. G3P2002 at [redacted]w[redacted]d Severe IUGR with measurements over the last 7 weeks between 0 and  2nd percentile.  Normal AFI, BPP and SD ratios.  Unclear if this is constitutionally small but given the severity are moving to induction of labor.  Patient with vaginal delivery x2.  We will plan Pitocin 2 x 2.  Possible cervical Foley. GBS negative   Watch fetal monitoring closely.  Baby at risk for distress for possible placental dysfunction given growth restriction.  Currently's NST overall reactive and plan attempted vaginal delivery given vaginal delivery x2.   Lendon Colonel 07/28/2021 8:34 AM

## 2021-07-28 NOTE — Anesthesia Preprocedure Evaluation (Signed)
Anesthesia Evaluation    Reviewed: Allergy & Precautions, Patient's Chart, lab work & pertinent test results  Airway Mallampati: I       Dental no notable dental hx.    Pulmonary    Pulmonary exam normal        Cardiovascular negative cardio ROS Normal cardiovascular exam     Neuro/Psych    GI/Hepatic   Endo/Other    Renal/GU      Musculoskeletal   Abdominal Normal abdominal exam  (+)   Peds  Hematology   Anesthesia Other Findings   Reproductive/Obstetrics (+) Pregnancy                             Anesthesia Physical Anesthesia Plan  ASA: 2  Anesthesia Plan: Epidural   Post-op Pain Management:    Induction:   PONV Risk Score and Plan: 0  Airway Management Planned: Natural Airway  Additional Equipment: None  Intra-op Plan:   Post-operative Plan:   Informed Consent: I have reviewed the patients History and Physical, chart, labs and discussed the procedure including the risks, benefits and alternatives for the proposed anesthesia with the patient or authorized representative who has indicated his/her understanding and acceptance.       Plan Discussed with:   Anesthesia Plan Comments: (Lab Results      Component                Value               Date                      WBC                      11.2 (H)            07/28/2021                HGB                      12.4                07/28/2021                HCT                      35.5 (L)            07/28/2021                MCV                      93.4                07/28/2021                PLT                      196                 07/28/2021           )        Anesthesia Quick Evaluation

## 2021-07-28 NOTE — MAU Note (Signed)
Selena Diaz is a 35 y.o. at [redacted]w[redacted]d here in MAU reporting: contractions started around 0400, they are every 4-5 minutes. No bleeding. No LOF. +FM  Onset of complaint: today  Pain score: 6/10  Vitals:   07/28/21 0730  BP: 108/71  Pulse: 78  Resp: 16  Temp: 97.9 F (36.6 C)  SpO2: 99%     HTM:BPJPETK deferred, pt wearing a long dress and is reporting + FM  Lab orders placed from triage: none

## 2021-07-28 NOTE — Progress Notes (Signed)
S: Request by Dr. Conni Elliot to assess patient for AROM. Resting in bed and denies feeling many contractions. Mother at the bedside providing support. Discussed the R/B/A of AROM for labor induction and patient consents to procedure. Eagerly anticipating baby girl "[place]".   O: Vitals:   07/28/21 1000 07/28/21 1028 07/28/21 1034 07/28/21 1108  BP: 108/81  106/60 110/71  Pulse: 86  68 73  Resp:      Temp:  97.8 F (36.6 C)    TempSrc:  Oral    SpO2:      Weight:      Height:       FHT:  FHR: 135 bpm, variability: moderate,  accelerations:  Present,  decelerations:  Absent UC:   occasional SVE:   Dilation:  4.5 Effacement (%):  60 Station:  -3 Exam by: Yetta Barre, CNM  AROM of a large amount of clear fluid at 1103. Fetal fingers felt on top of head. Unable to reduce hand due to patient's intolerance of exam.    A / P: Induction of labor due to IUGR, admitted for labor that has now stalled, AROM of clear fluid  Fetal Wellbeing:  Category I Pain Control:  Labor support without medications Anticipated MOD:  NSVD  Will start Pitocin 2x2 until adequate contractions. Desires unmedicated delivery. Anticipate precipitous delivery.   June Leap, CNM, MSN 07/28/2021, 11:09 AM

## 2021-07-28 NOTE — Lactation Note (Signed)
This note was copied from a baby's chart. Lactation Consultation Note  Patient Name: Selena Diaz BDZHG'D Date: 07/28/2021 Reason for consult: L&D Initial assessment;Infant < 6lbs;Early term 37-38.6wks Age:33 hours  LC in to assist with first feeding.  Baby swaddled on Mom's chest when entering room.  RN was just about to do baby's vitals.  LC asked if baby had latched or was cueing.  Baby quiet alert.    LC placed baby STS on Mom's chest with blankets over baby.  Baby fussy and wouldn't latch.    RN trying to get baby's temperature as first temp was low.   Talked to Mom regarding her needing to double pump to support her milk supply as her history is of low milk volume with her 2 prior babies.  Breasts are small but normal breast tissue noted.  Slightly wide spaced breasts but + changes with pregnancy.  Talked about the importance of baby receiving supplementation due to low birth weight and low temps.    Baby's birth weight is 4lbs 10oz.  Baby currently under warmer in L&D room.   Maternal Data Has patient been taught Hand Expression?: Yes Does the patient have breastfeeding experience prior to this delivery?: Yes How long did the patient breastfeed?: 1 months  Feeding Mother's Current Feeding Choice: Breast Milk and Formula  LATCH Score Latch: Too sleepy or reluctant, no latch achieved, no sucking elicited.  Audible Swallowing: None  Type of Nipple: Everted at rest and after stimulation  Comfort (Breast/Nipple): Soft / non-tender  Hold (Positioning): Full assist, staff holds infant at breast  LATCH Score: 4   Interventions Interventions: Breast feeding basics reviewed;Skin to skin;Breast massage;Hand express;Support pillows   Consult Status Consult Status: Follow-up from L&D Date: 07/28/21 Follow-up type: In-patient    Selena Diaz 07/28/2021, 6:09 PM

## 2021-07-29 NOTE — Lactation Note (Signed)
This note was copied from a baby's chart. Lactation Consultation Note  Patient Name: Selena Diaz ZHYQM'V Date: 07/29/2021 Reason for consult: Initial assessment;Infant < 6lbs;Early term 37-38.6wks Age:33 hours  P3, Baby latched upon entering.  < 5 lbs. Mother concerned about her milk supply. Discussed post pumping to stimulate her supply. Reviewed volume guidelines for supplementation increasing per day of life and as baby desires. Give volume to baby with the difference with formula after each feeding.  LPI infor mation given.                                                                                                                                                   Mom made aware of O/P services, breastfeeding support groups, community resources, and our phone # for post-discharge questions.     Feeding Mother's Current Feeding Choice: Breast Milk and Formula  LATCH Score Latch: Repeated attempts needed to sustain latch, nipple held in mouth throughout feeding, stimulation needed to elicit sucking reflex.  Audible Swallowing: Spontaneous and intermittent  Type of Nipple: Everted at rest and after stimulation  Comfort (Breast/Nipple): Soft / non-tender  Hold (Positioning): No assistance needed to correctly position infant at breast.  LATCH Score: 9   Lactation Tools Discussed/Used  DEBP q 3 hours  Interventions Interventions: Breast feeding basics reviewed;Assisted with latch;Support pillows;Education;DEBP  Discharge    Consult Status Consult Status: Follow-up Date: 07/30/21 Follow-up type: In-patient    Dahlia Byes [facility] 07/29/2021, 12:18 PM

## 2021-07-29 NOTE — Anesthesia Postprocedure Evaluation (Signed)
Anesthesia Post Note  Patient: Evagelia Prouty  Procedure(s) Performed: AN AD HOC LABOR EPIDURAL     Patient location during evaluation: Mother Baby Anesthesia Type: Epidural Level of consciousness: awake and alert and oriented Pain management: satisfactory to patient Vital Signs Assessment: post-procedure vital signs reviewed and stable Respiratory status: respiratory function stable Cardiovascular status: stable Postop Assessment: no headache, no backache, epidural receding, patient able to bend at knees, no signs of nausea or vomiting, adequate PO intake and able to ambulate Anesthetic complications: no   No notable events documented.  Last Vitals:  Vitals:   07/29/21 0054 07/29/21 0511  BP: 96/61 (!) 95/56  Pulse: 66 (!) 59  Resp: 18 18  Temp: 36.7 C 36.6 C  SpO2: 99% 100%    Last Pain:  Vitals:   07/29/21 0740  TempSrc:   PainSc: 0-No pain   Pain Goal:                   Manju Kulkarni

## 2021-07-29 NOTE — Progress Notes (Signed)
    PPD # 1 S/P NSVD  Live born female  Birth Weight: 4 lb 10.4 oz (2110 g) APGAR: 8, 9  Newborn Delivery   Birth date/time: 07/28/2021 16:44:00 Delivery type: Vaginal, Spontaneous     Baby name: NTIRWE Delivering provider: Noland Fordyce  Episiotomy:None   Lacerations:None   Feeding: breast and bottle  Pain control at delivery: Epidural   S:  Reports feeling well, mild back soreness and cramping.             Tolerating po/ No nausea or vomiting             Bleeding is light             Pain controlled with acetaminophen and ibuprofen (OTC)             Up ad lib / ambulatory / voiding without difficulties   O:  A & O x 3, in no apparent distress              VS:  Vitals:   07/28/21 2001 07/28/21 2113 07/29/21 0054 07/29/21 0511  BP: 107/67 98/69 96/61  (!) 95/56  Pulse: 77 90 66 (!) 59  Resp: 18 18 18 18   Temp: 98.6 F (37 C) 98.4 F (36.9 C) 98 F (36.7 C) 97.9 F (36.6 C)  TempSrc: Oral Oral Oral Oral  SpO2: 97% 99% 99% 100%  Weight:      Height:        LABS:  Recent Labs    07/28/21 0819  WBC 11.2*  HGB 12.4  HCT 35.5*  PLT 196    Blood type: --/--/A POS (08/24 0819)  Rubella:   immune  I&O: I/O last 3 completed shifts: In: -  Out: 900 [Urine:750; Blood:150]          No intake/output data recorded.   Gen: AAO x 3, NAD  Abdomen: soft, non-tender, non-distended             Fundus: firm, non-tender, U-1  Perineum: intact, no edema  Lochia: small  Extremities: no edema, no calf pain or tenderness    A/P: PPD # 1 35 y.o., 32   Principal Problem:   Postpartum care following vaginal delivery 8/24 Active Problems:   Encounter for induction of labor   SVD (spontaneous vaginal delivery)   Doing well - stable status  Routine post partum orders  Anticipate discharge tomorrow    9/24, MSN, CNM 07/29/2021, 8:44 AM

## 2021-07-30 ENCOUNTER — Ambulatory Visit: Payer: No Typology Code available for payment source

## 2021-07-30 ENCOUNTER — Ambulatory Visit: Payer: PPO

## 2021-07-30 ENCOUNTER — Ambulatory Visit: Payer: Self-pay

## 2021-07-30 LAB — SURGICAL PATHOLOGY

## 2021-07-30 MED ORDER — COCONUT OIL OIL: 1.0000 "application " | TOPICAL_OIL | 0 refills | Status: DC | PRN

## 2021-07-30 MED ORDER — BENZOCAINE-MENTHOL 20-0.5 % EX AERO: 1.0000 "application " | INHALATION_SPRAY | CUTANEOUS | Status: DC | PRN

## 2021-07-30 MED ORDER — IBUPROFEN 600 MG PO TABS: 600.0000 mg | ORAL_TABLET | Freq: Four times a day (QID) | ORAL | 0 refills | Status: DC

## 2021-07-30 MED ORDER — ACETAMINOPHEN 500 MG PO TABS: 1000.0000 mg | ORAL_TABLET | Freq: Four times a day (QID) | ORAL | 2 refills | Status: AC | PRN

## 2021-07-30 NOTE — Progress Notes (Signed)
Ordered Breast pump for pt thru the "[employer]" resource.  Called [phone] and notified them.

## 2021-07-30 NOTE — Lactation Note (Addendum)
This note was copied from a baby's chart. Lactation Consultation Note  Patient Name: Selena Diaz GEXBM'W Date: 07/30/2021 Reason for consult: Follow-up assessment;Early term 37-38.6wks;Infant weight loss (-5% weight loss) Age:33 hours LC entered the room, mom was feeding sleepy infant formula, infant had  taken 20 mls mom didn't latch infant with this feeding. LC observed infant with clothing and swaddled with two blankets in a warm room.  Per mom, when using the DEBP it hurt and she quit using it, per mom, she used the  DEBP dial to  its highest setting. LC explained how to use DEBP pump at mom's comfort level  and not to dial pump to it's highest setting. LC reviewed hand expression and mom expressed 5 mls of colostrum that she will offer infant at next feeding before supplementing infant with formula. Mom will latch infant at breast  for every third feeding and afterwards still supplement infant with EBM first and then formula.  Mom understands based on infant's age/ hours of life  Day 3  if breastfeed afterwards supplement infant with 25+ mls per feeding; and if not latched at breast ( 30 - 60+ mls) per feeding.  Mom will start pumping every 3 hours to help establish her milk supply. Mom will work towards giving infant higher volume of EBM and formula based on infant's age and hours of life.  Maternal Data    Feeding Mother's Current Feeding Choice: Breast Milk and Formula Nipple Type: Nfant Slow Flow (purple)  LATCH Score                    Lactation Tools Discussed/Used    Interventions Interventions: Skin to skin;Hand express;DEBP  Discharge    Consult Status Consult Status: Follow-up Date: 07/31/21 Follow-up type: In-patient    Danelle Earthly 07/30/2021, 10:32 PM

## 2021-07-30 NOTE — Discharge Summary (Signed)
OB Discharge Summary  Patient Name: Selena Diaz DOB: 08-Dec-1985 MRN: [id number]  Date of admission: 07/28/2021 Delivering provider: Noland Fordyce   Admitting diagnosis: Encounter for induction of labor [Z34.90] Intrauterine pregnancy: [redacted]w[redacted]d     Secondary diagnosis: Patient Active Problem List   Diagnosis Date Noted   SVD (spontaneous vaginal delivery) 07/29/2021   Postpartum care following vaginal delivery 8/24 07/29/2021   Encounter for induction of labor 07/28/2021   Ganglion cyst of dorsum of right wrist 11/01/2016   Vitamin D deficiency 10/17/2016   Additional problems:none   Date of discharge: 07/30/2021   Discharge diagnosis: Principal Problem:   Postpartum care following vaginal delivery 8/24 Active Problems:   Encounter for induction of labor   SVD (spontaneous vaginal delivery)                                                              Post partum procedures: none  Augmentation: AROM and Pitocin Pain control: Epidural  Laceration:None  Episiotomy:None  Complications: Partial abruption  Hospital course:  Induction of Labor With Vaginal Delivery   35 y.o. yo G3P3003 at [redacted]w[redacted]d was admitted to the hospital 07/28/2021 for induction of labor.  Indication for induction:  severe IUGR .  Patient had an uncomplicated labor course as follows: Membrane Rupture Time/Date: 11:03 AM ,07/28/2021   Delivery Method:Vaginal, Spontaneous  Episiotomy: None  Lacerations:  None  Details of delivery can be found in separate delivery note.  Patient had a routine postpartum course. Patient is discharged home 07/30/21.  Newborn Data: Birth date:07/28/2021  Birth time:4:44 PM  Gender:Female  Living status:Living  Apgars:8 ,9  Weight:2110 g   Physical exam  Vitals:   07/29/21 0932 07/29/21 2119 07/30/21 0543 07/30/21 1100  BP: 97/74 100/68 110/62 93/64  Pulse: 67 65 62 76  Resp: 16 18 18    Temp: 98.2 F (36.8 C) 98 F (36.7 C) 98.1 F (36.7 C) 98 F (36.7 C)  TempSrc:  Oral Oral Oral Oral  SpO2: 100% 100% 100% 100%  Weight:      Height:       General: alert, cooperative, and no distress Lochia: appropriate Uterine Fundus: firm Incision: N/A Perineum: intact, no edema DVT Evaluation: No cords or calf tenderness. No significant calf/ankle edema. Labs: Lab Results  Component Value Date   WBC 11.2 (H) 07/28/2021   HGB 12.4 07/28/2021   HCT 35.5 (L) 07/28/2021   MCV 93.4 07/28/2021   PLT 196 07/28/2021   CMP Latest Ref Rng & Units 11/12/2015  Glucose 65 - 99 mg/dL 94  BUN 7 - 25 mg/dL 9  Creatinine 14/07/2015 - 3.54 mg/dL 6.56  Sodium 8.12 - 751 mmol/L 139  Potassium 3.5 - 5.3 mmol/L 4.4  Chloride 98 - 110 mmol/L 105  CO2 20 - 31 mmol/L 27  Calcium 8.6 - 10.2 mg/dL 9.3  Total Protein 6.1 - 8.1 g/dL 7.0  Total Bilirubin 0.2 - 1.2 mg/dL 0.4  Alkaline Phos 33 - 115 U/L 42  AST 10 - 30 U/L 13  ALT 6 - 29 U/L 9   Edinburgh Postnatal Depression Scale Screening Tool 07/30/2021  I have been able to laugh and see the funny side of things. 0  I have looked forward with enjoyment to things. 0  I have blamed myself unnecessarily when  things went wrong. 1  I have been anxious or worried for no good reason. 1  I have felt scared or panicky for no good reason. 1  Things have been getting on top of me. 1  I have been so unhappy that I have had difficulty sleeping. 0  I have felt sad or miserable. 1  I have been so unhappy that I have been crying. 0  The thought of harming myself has occurred to me. 0  Edinburgh Postnatal Depression Scale Total 5    Discharge instruction:  per After Visit Summary,  [facility] booklet and  "Understanding Mother & Baby Care" hospital booklet  After Visit Meds:  Allergies as of 07/30/2021   No Known Allergies      Medication List     TAKE these medications    acetaminophen 500 MG tablet Commonly known as: TYLENOL Take 2 tablets (1,000 mg total) by mouth every 6 (six) hours as needed.   benzocaine-Menthol 20-0.5  % Aero Commonly known as: DERMOPLAST Apply 1 application topically as needed for irritation (perineal discomfort).   coconut oil Oil Apply 1 application topically as needed.   ibuprofen 600 MG tablet Commonly known as: ADVIL Take 1 tablet (600 mg total) by mouth every 6 (six) hours.   PRENATAL VITAMIN PO Take by mouth.               Durable Medical Equipment  (From admission, onward)           Start     Ordered   07/30/21 1410  For home use only DME double electric breast pump  Once       Comments: ICD-10 code Z39.1   07/30/21 1410              Discharge Care Instructions  (From admission, onward)           Start     Ordered   07/30/21 0000  Discharge wound care:       Comments: Sitz baths 2 times /day with warm water x 1 week. May add herbals: 1 ounce dried comfrey leaf* 1 ounce calendula flowers 1 ounce lavender flowers  Supplies can be found online at [employer] sources at [employer], [employer]  1/2 ounce dried uva ursi leaves 1/2 ounce witch hazel blossoms (if you can find them) 1/2 ounce dried sage leaf 1/2 cup sea salt Directions: Bring 2 quarts of water to a boil. Turn off heat, and place 1 ounce (approximately 1 large handful) of the above mixed herbs (not the salt) into the pot. Steep, covered, for 30 minutes.  Strain the liquid well with a fine mesh strainer, and discard the herb material. Add 2 quarts of liquid to the tub, along with the 1/2 cup of salt. This medicinal liquid can also be made into compresses and peri-rinses.   07/30/21 1409            Diet: iron rich diet  Activity: Advance as tolerated. Pelvic rest for 6 weeks.   Postpartum contraception: Not Discussed  Newborn Data: Live born female  Birth Weight: 4 lb 10.4 oz (2110 g) APGAR: 8, 9  Newborn Delivery   Birth date/time: 07/28/2021 16:44:00 Delivery type: Vaginal, Spontaneous      named Ghiada Baby Feeding: Bottle and  Breast Disposition: remains inpatient for poor feeds  Delivery Report:  Review the Delivery Report for details.    Follow up:  Follow-up Information     [place],  Tresa Endo, MD. Schedule an appointment as soon as possible for a visit in 6 week(s).   Specialty: Obstetrics and Gynecology Why: For Postpartum follow-up Contact information: [address] [place] [address] [phone]                   Signed: Neta Mends, CNM, MSN 07/30/2021, 2:10 PM

## 2021-07-30 NOTE — Discharge Instructions (Signed)
Lactation outpatient support - home visit   Jessica Bowers, IBCLC (lactation consultant)  & Birth Doula  Phone (text or call): [phone] Email: [email or web address]   Linda Coppola RN, MHA, IBCLC at [facility]: Lactation Consultant  [email or web address] Mail: [email or web address] Tel: [phone]    Additional breastfeeding resources:  [facility] https://ibconline.ca/information-sheets/  [employer] [facility] [place]  www.[email or web address]  Chiropractic specialist   Dr. Leanna Hastings [email or web address]   Craniosacral therapy for baby  Erin Balkind  [email or web address]  

## 2021-07-31 ENCOUNTER — Ambulatory Visit: Payer: Self-pay

## 2021-07-31 NOTE — Lactation Note (Addendum)
This note was copied from a baby's chart. Lactation Consultation Note  Patient Name: Selena Diaz RWERX'V Date: 07/31/2021 Reason for consult: Follow-up assessment;Early term 37-38.6wks;Infant < 6lbs Age:33 hours  Mom says she does not even get 1 mL when pumping. Hand expression was reviewed with Mom and she was shown a different technique that allowed her to express more. Mom may choose to hand express instead of using a pump to get more milk out. Mom's milk has not come to volume, yet.   Infant latched with relative ease, but had very long pauses between suck:swallow bursts. I encouraged Mom to go ahead and feed infant a bottle. The difference between non-nutritive and nutritive sucking was discussed. Mom knows feedings should not last longer than 30 minutes including any breast attempts.   Mom's questions were answered to her satisfaction. Mom declines an outpatient lactation appt at this time.  Lurline Hare [facility] 07/31/2021, 2:57 PM

## 2021-08-10 ENCOUNTER — Telehealth (HOSPITAL_COMMUNITY): Payer: Self-pay | Admitting: *Deleted

## 2021-08-10 NOTE — Telephone Encounter (Signed)
Hospital Discharge Follow-Up Calls:  Patient reports that she is doing fine and has no concerns about her health or healing process.  EPDS today was a 0 and she feels that this accurately reflects that she is doing well emotionally.  She says that baby is well and she has no concerns about the baby's health at this time.  She reports that baby sleeps in a crib.  Reviewed ABCs of Safe Sleep.

## 2022-01-23 IMAGING — US US MFM UA CORD DOPPLER
1 series · 14 of 28 positions shown · non-contrast
Comparison: none

[Series 1: us mfm ua cord doppler · 31 acquisitions, 14 frames shown]
[im 2/31]
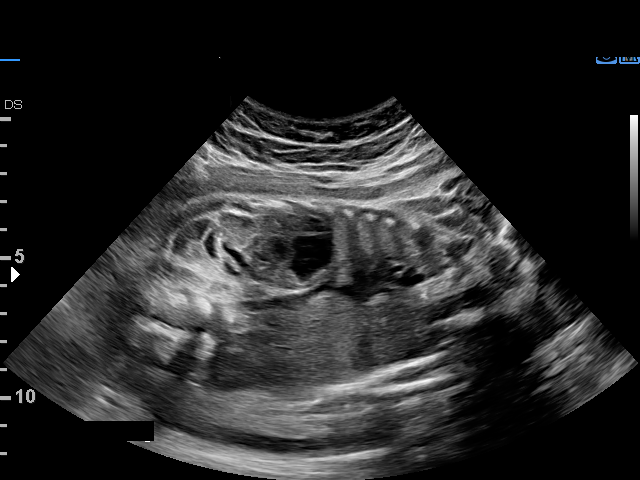
[im 4/31]
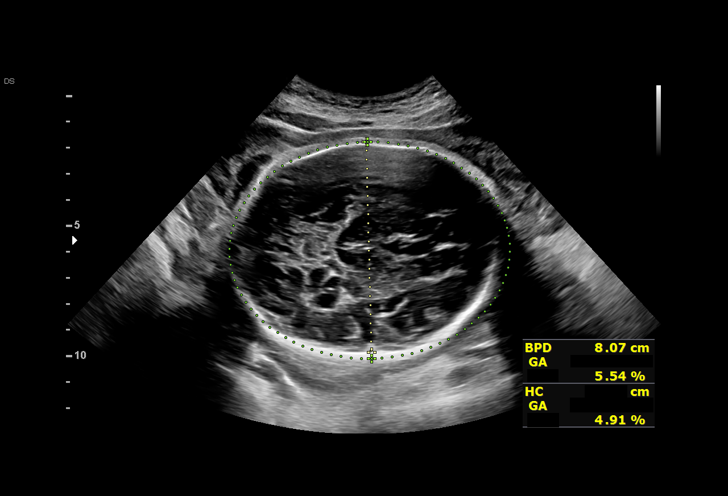
[im 6/31]
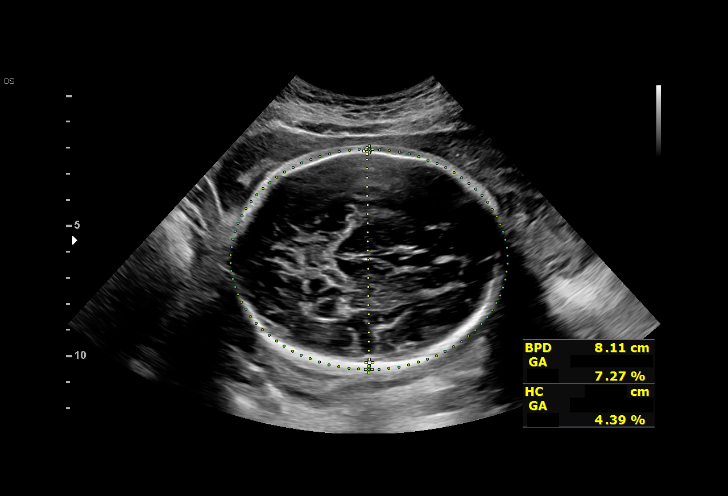
[im 8/31]
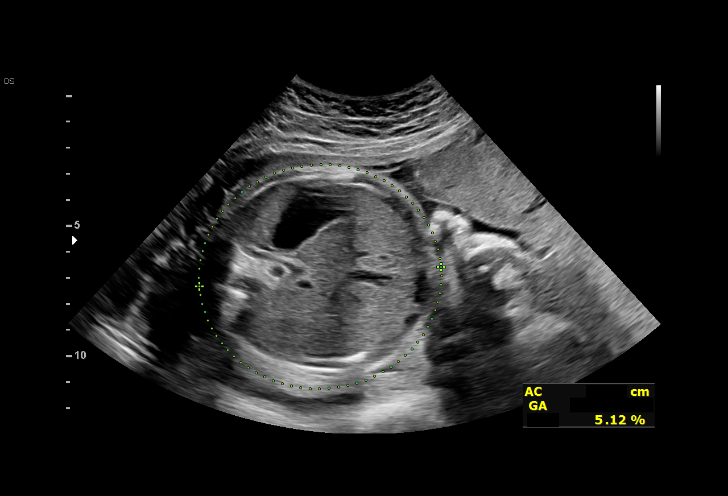
[im 11/31]
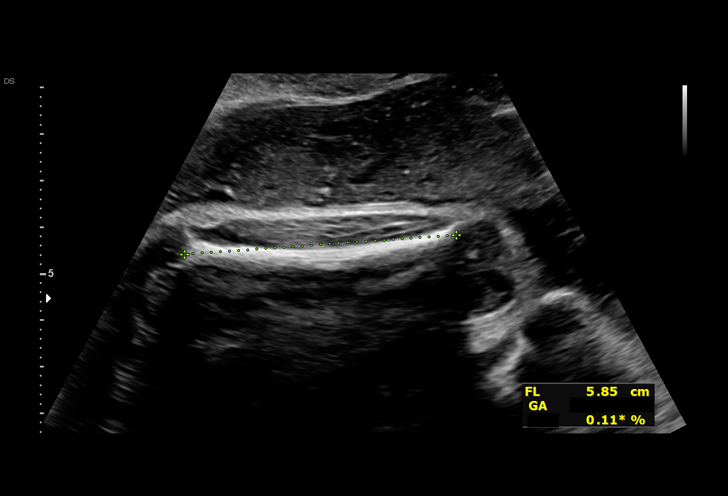
[im 13/31]
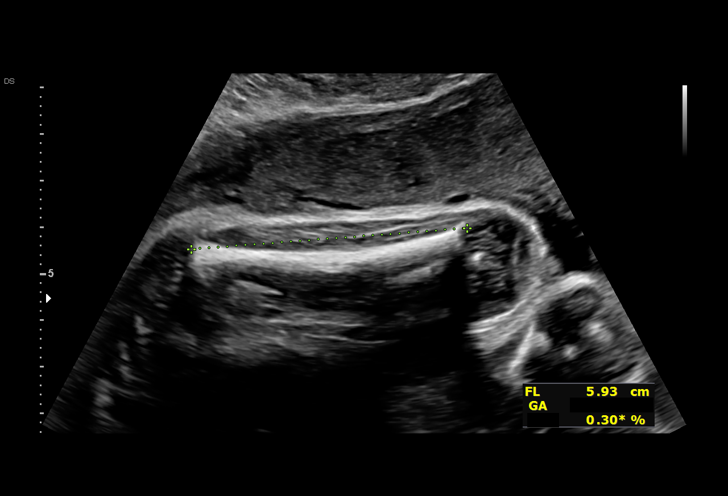
[im 15/31]
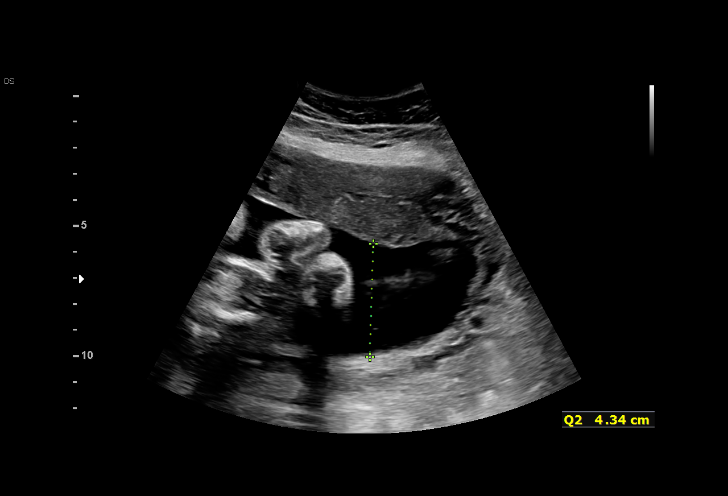
[im 17/31]
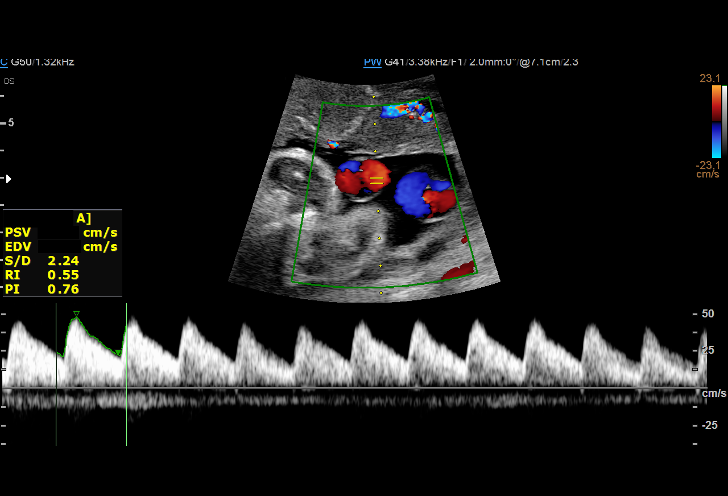
[im 19/31]
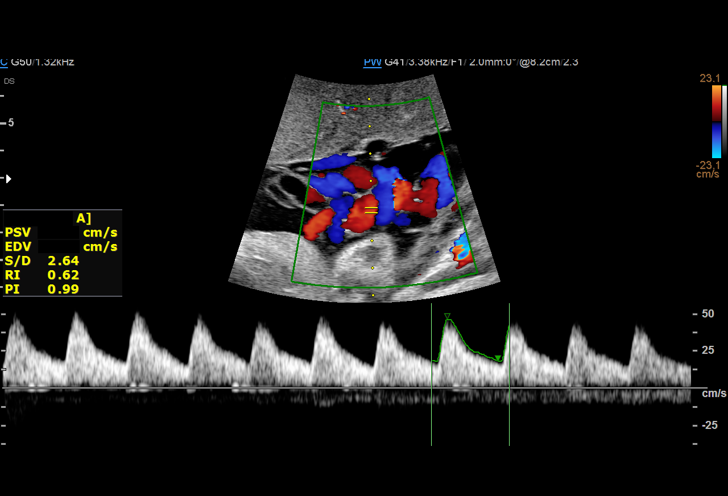
[im 22/31]
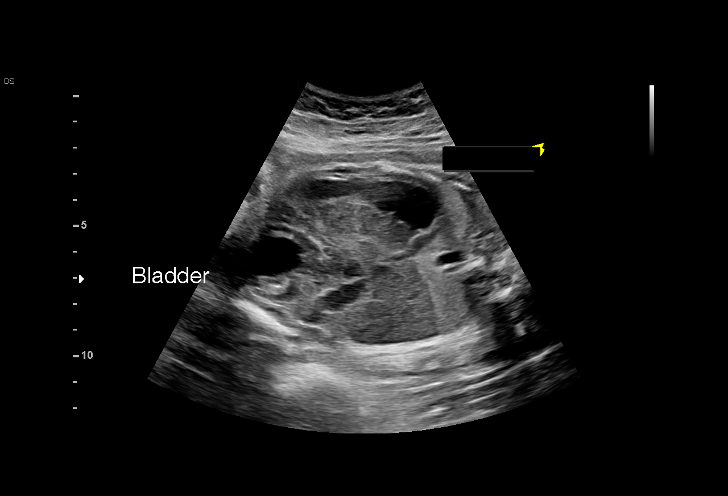
[im 24/31]
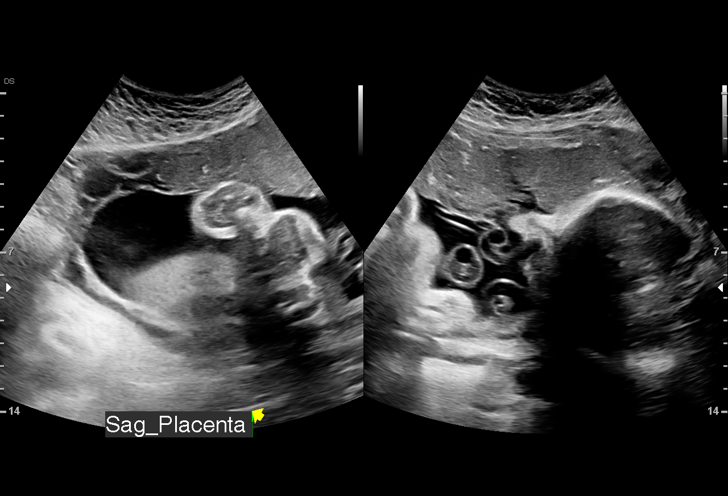
[im 26/31]
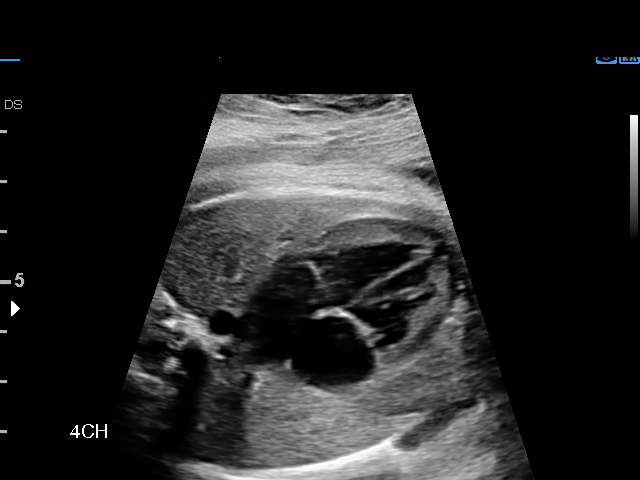
[im 28/31]
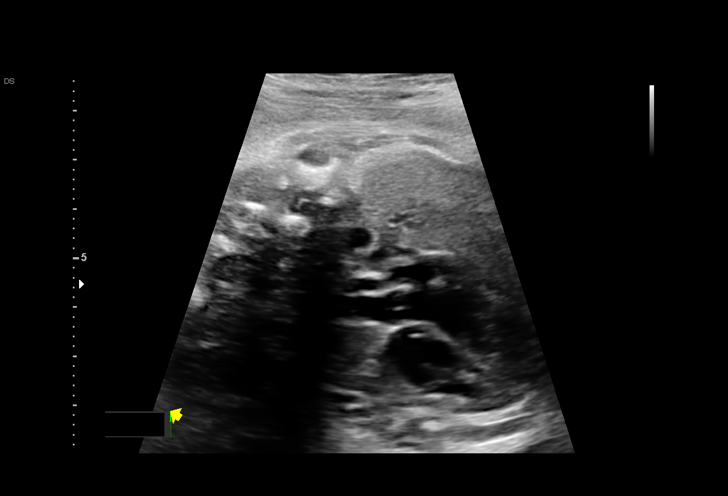
[im 31/31]
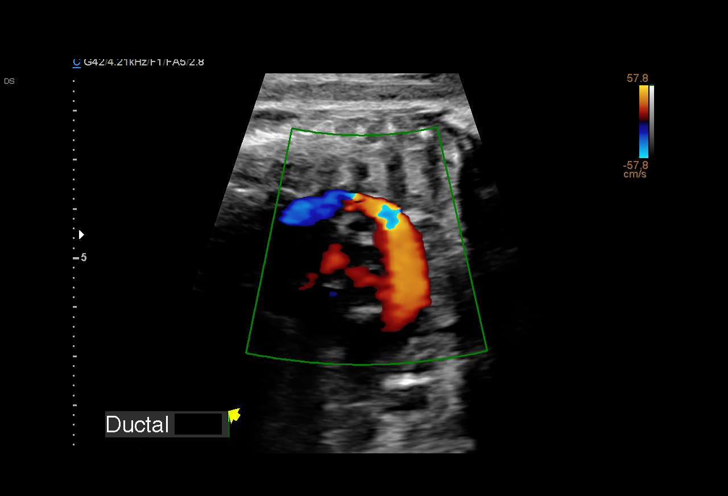

[14 of 28 positions shown; findings below may reference images not displayed]

OB/GYN &
                                                            Infertility
                                                            [id number] [HOSPITAL]

Indications

 Maternal care for known or suspected poor
 fetal growth, third trimester, not applicable or
 unspecified IUGR
 Marginal insertion of umbilical cord affecting
 management of mother in third trimester
 Encounter for antenatal screening for
 malformations
 LR NIPS (F), AFP - negative
 34 weeks gestation of pregnancy
Fetal Evaluation

 Num Of Fetuses:         1
 Cardiac Activity:       Observed
 Presentation:           Cephalic
 Placenta:               Anterior left
 P. Cord Insertion:      Marginal insertion
 Amniotic Fluid
 AFI FV:      Within normal limits

 AFI Sum(cm)     %Tile       Largest Pocket(cm)
 9.02            12

 RUQ(cm)       RLQ(cm)       LUQ(cm)

Biophysical Evaluation

 Amniotic F.V:   Pocket => 2 cm             F. Tone:        Observed
 F. Movement:    Observed                   Score:          [DATE]
 F. Breathing:   Observed
Biometry

 BPD:        81  mm     G. Age:  32w 4d          7  %    CI:        73.24   %    70 - 86
                                                         FL/HC:      19.6   %    19.4 -
 HC:      300.8  mm     G. Age:  33w 3d          4  %    HC/AC:      1.08        0.96 -
 AC:      278.6  mm     G. Age:  31w 6d        3.4  %    FL/BPD:     72.7   %    71 - 87
 FL:       58.9  mm     G. Age:  30w 5d        < 1  %    FL/AC:      21.1   %    20 - 24
 LV:        6.8  mm

 Est. FW:    2857  gm      4 lb 1 oz      2  %
Gestational Age

 LMP:           34w 3d        Date:  11/10/20                 EDD:   08/17/21
 U/S Today:     32w 1d                                        EDD:   09/02/21
 Best:          34w 3d     Det. By:  LMP  (11/10/20)          EDD:   08/17/21
Anatomy

 Cranium:               Appears normal         LVOT:                   Appears normal
 Cavum:                 Appears normal         Aortic Arch:            Appears normal
 Ventricles:            Appears normal         Ductal Arch:            Appears normal
 Choroid Plexus:        Appears normal         Stomach:                Appears normal, left
                                                                       sided
 Heart:                 Appears normal         Kidneys:                Appear normal
                        (4CH, axis, and
                        situs)
 RVOT:                  Appears normal         Bladder:                Appears normal
Doppler - Fetal Vessels

 Umbilical Artery
  S/D     %tile      RI    %tile      PI    %tile     PSV    ADFV    RDFV
                                                    (cm/s)
  2.46       47    0.59       52    0.[REDACTED]      No      No

Comments

 This patient was seen for a follow up growth scan due to fetal
 growth restriction noted during her prior ultrasound exam.
 She denies any problems since her last exam and reports
 feeling vigorous fetal movements throughout the day.  She
 also reports that she has a history of having smaller sized
 babies.  Her two other children were delivered at term
 weighing between 5 to 6 pounds.
 On today's exam, the EFW measures at the second
 percentile for her gestational age indicating fetal growth
 restriction.  The fetus has grown over 1 pound over the past 3
 weeks.  There was normal amniotic fluid noted.
 A biophysical profile performed today due to fetal growth
 restriction was [DATE].
 Doppler studies of the umbilical arteries showed a normal
 S/D ratio of 2.46.  There were no signs of absent or reversed
 end-diastolic flow.
 Due to fetal growth restriction, we will continue to follow her
 with weekly BPPs and umbilical artery Doppler studies.
 Due to fetal growth restriction, delivery is recommended at
 around 37 weeks.

## 2022-01-30 IMAGING — US US MFM UA CORD DOPPLER
1 series · 15 of 28 positions shown · non-contrast
Comparison: none

[Series 1: us mfm ua cord doppler · 40 acquisitions, 15 frames shown]
[im 1/40]
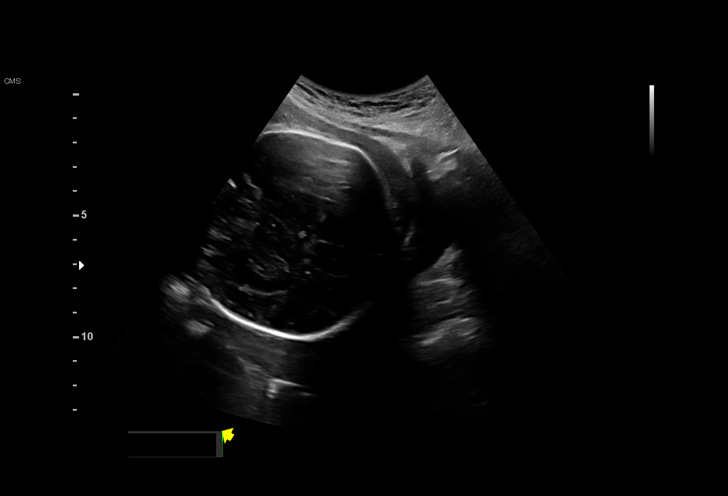
[im 3/40]
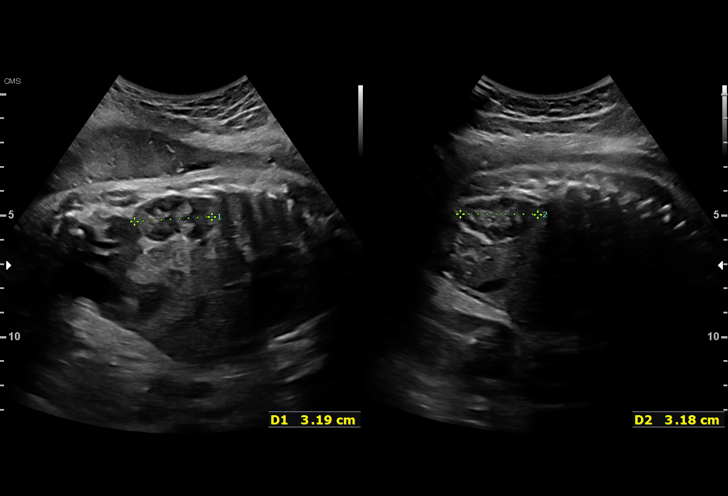
[im 6/40]
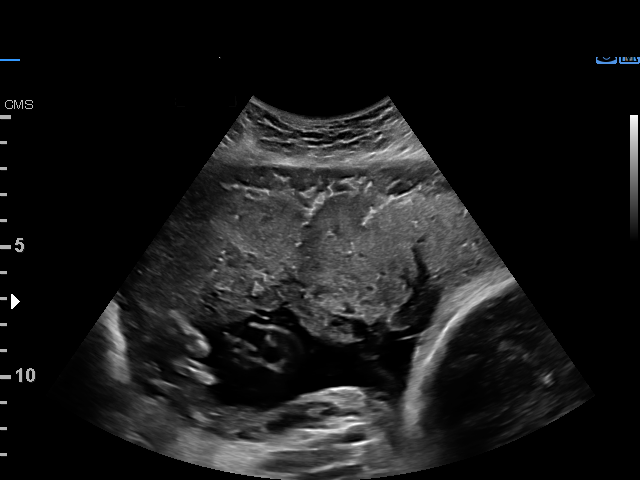
[im 9/40]
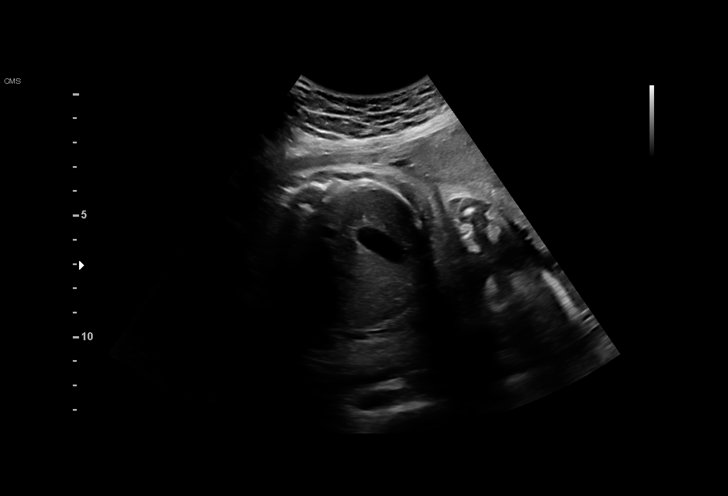
[im 12/40]
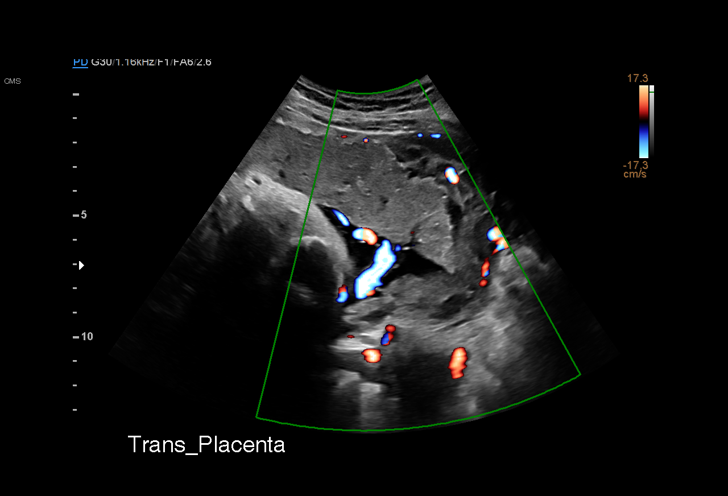
[im 15/40]
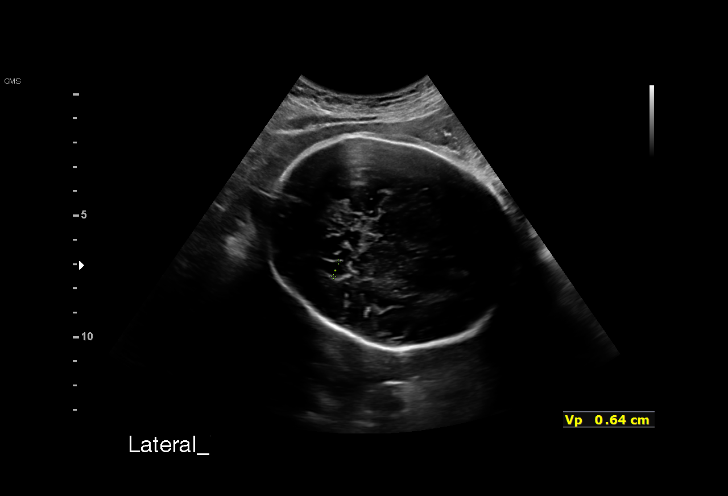
[im 18/40]
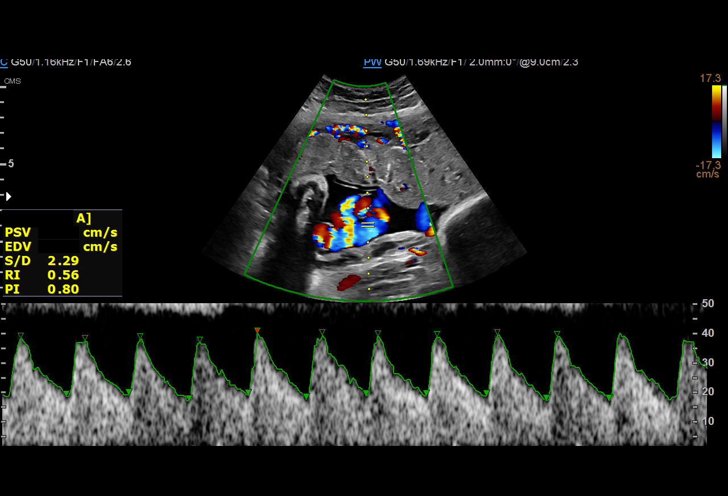
[im 21/40]
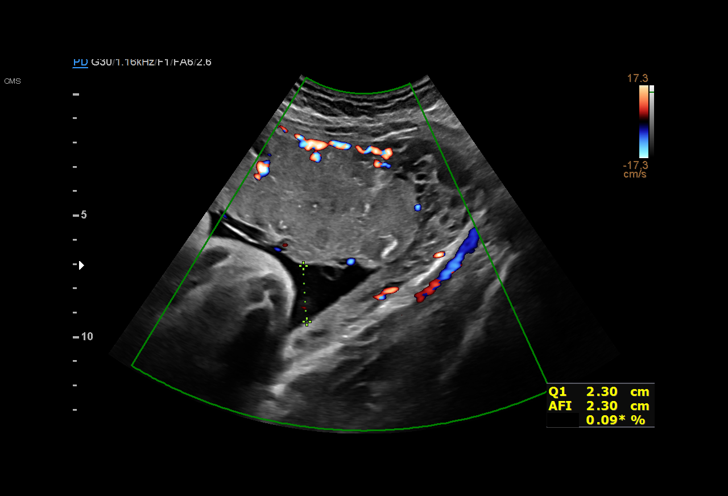
[im 22/40]
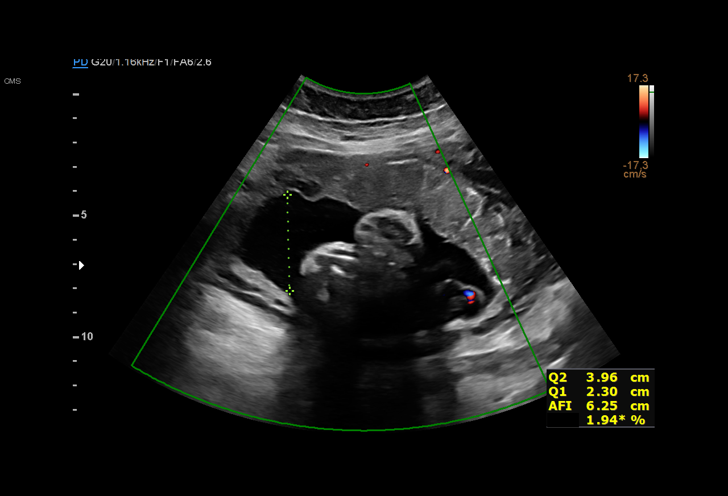
[im 25/40]
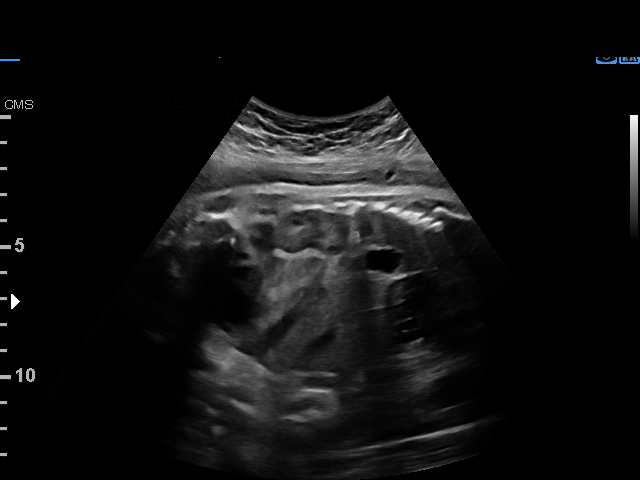
[im 28/40]
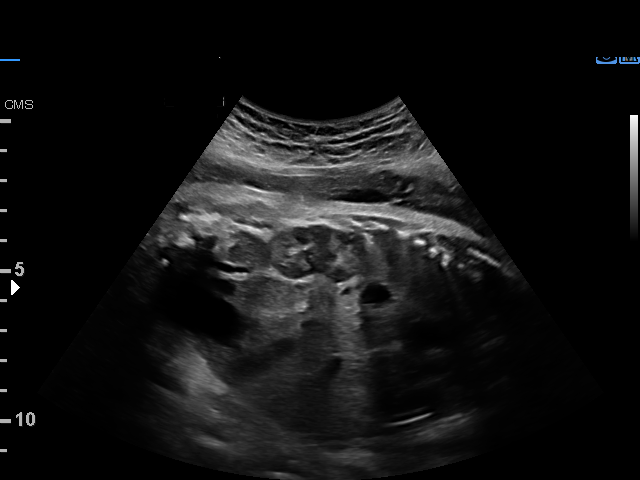
[im 31/40]
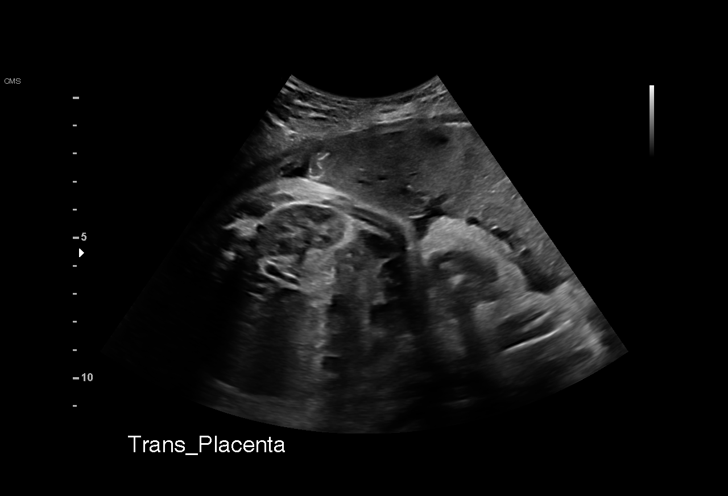
[im 34/40]
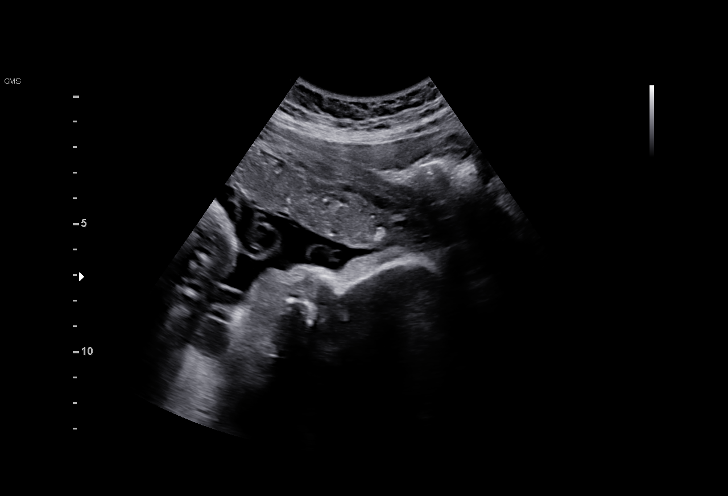
[im 37/40]
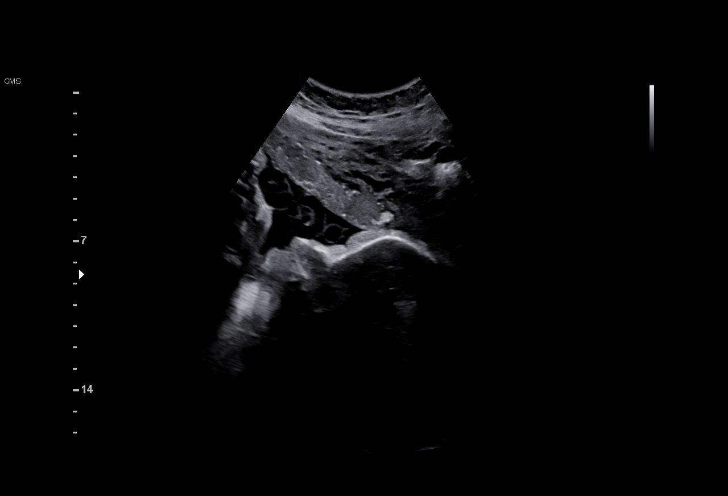
[im 40/40]
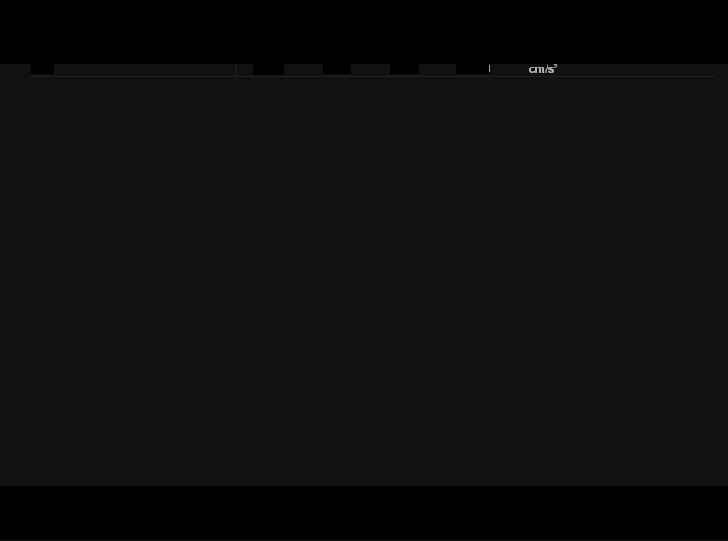

[15 of 28 positions shown; findings below may reference images not displayed]

OB/GYN &
                                                            Infertility
                                                            [id number] [HOSPITAL]

    W/NONSTRESS

Indications

 Maternal care for known or suspected poor
 fetal growth, third trimester, not applicable or
 unspecified IUGR
 Marginal insertion of umbilical cord affecting
 management of mother in third trimester
 Encounter for other antenatal screening
 follow-up
 LR NIPS (F), AFP - negative
 35 weeks gestation of pregnancy
Fetal Evaluation

 Num Of Fetuses:         1
 Fetal Heart Rate(bpm):  133
 Cardiac Activity:       Observed
 Presentation:           Cephalic
 Placenta:               Anterior Left
 P. Cord Insertion:      Marginal insertion prev vis
 Amniotic Fluid
 AFI FV:      Within normal limits

 AFI Sum(cm)     %Tile       Largest Pocket(cm)
 10.7            26          4

 RUQ(cm)       RLQ(cm)       LUQ(cm)        LLQ(cm)
 2.3           3             4
Biophysical Evaluation

 Amniotic F.V:   Within normal limits       F. Tone:        Observed
 F. Movement:    Observed                   N.S.T:          Reactive
 F. Breathing:   Observed                   Score:          [DATE]
Biometry

 LV:        6.4  mm
Gestational Age

 LMP:           35w 3d        Date:  11/10/20                 EDD:   08/17/21
 Best:          35w 3d     Det. By:  LMP  (11/10/20)          EDD:   08/17/21
Doppler - Fetal Vessels

 Umbilical Artery
  S/D     %tile      RI    %tile      PI    %tile            ADFV    RDFV
  2.26       38    0.56       42    0.83       50               No      No

Impression

 Severe fetal growth restriction.  Patient return for antenatal
 testing.  On previous ultrasound, the estimated fetal weight
 was at the 2nd percentile.

 Amniotic fluid is normal and good fetal activity is seen .
 Umbilical artery Doppler showed normal forward diastolic
 flow.  Antenatal testing is reassuring.  NST is reactive.  BPP
 [DATE].
 Cephalic presentation.

 Patient would like to get her ultrasound performed at your
 office next week.
Recommendations

 -BPP, NST and UA Doppler next week at your office.
 -Delivery at 37 weeks' gestation.
                 Tasama, Luis Suares

## 2022-09-13 ENCOUNTER — Ambulatory Visit: Payer: PPO | Admitting: Medical

## 2023-05-07 ENCOUNTER — Other Ambulatory Visit: Payer: Self-pay

## 2023-05-07 ENCOUNTER — Encounter (HOSPITAL_COMMUNITY): Payer: Self-pay | Admitting: Emergency Medicine

## 2023-05-07 ENCOUNTER — Ambulatory Visit (HOSPITAL_COMMUNITY)
Admission: EM | Admit: 2023-05-07 | Discharge: 2023-05-07 | Disposition: A | Discharge status: Home / Self Care | Payer: PPO | Attending: Emergency Medicine | Admitting: Emergency Medicine

## 2023-05-07 DIAGNOSIS — J029 Acute pharyngitis, unspecified: Secondary | ICD-10-CM | POA: Insufficient documentation

## 2023-05-07 DIAGNOSIS — Z20818 Contact with and (suspected) exposure to other bacterial communicable diseases: Secondary | ICD-10-CM | POA: Insufficient documentation

## 2023-05-07 DIAGNOSIS — J329 Chronic sinusitis, unspecified: Secondary | ICD-10-CM | POA: Diagnosis not present

## 2023-05-07 LAB — POCT RAPID STREP A (OFFICE): Rapid Strep A Screen: NEGATIVE

## 2023-05-07 MED ORDER — AZITHROMYCIN 250 MG PO TABS: ORAL_TABLET | ORAL | 0 refills | Status: AC

## 2023-05-07 MED ORDER — FLUTICASONE PROPIONATE 50 MCG/ACT NA SUSP: 1.0000 | Freq: Every day | NASAL | 2 refills | Status: DC

## 2023-05-07 NOTE — Discharge Instructions (Signed)
Your strep test today is negative.  Streptococcal throat culture will be performed per our protocol, please keep in mind that the rapid strep test that we perform here at urgent care only catches 40% of strep throat infections.   Based on my physical exam findings and the history you provided to me today, I recommend that you begin antibiotics now for presumed strep throat instead of waiting for the strep culture result.  I have sent a prescription to your pharmacy.  After 24 hours of antibiotics, you should begin to feel significantly better.     If your streptococcal throat culture has a negative result but you feel significantly better after taking antibiotics for 24 to 48 hours, I strongly recommend that you finish the full 10-day course.  Bacterial culture tests are only as reliable as the laboratory technician performing them.     Alternately, if your streptococcal throat culture has a negative result and you see no improvement of your symptoms after 24 to 48 hours of antibiotics, please discontinue the antibiotics as they are no longer indicated.  Your throat infection will then be most likely considered viral and will have to resolve on its own.   After 24 hours of taking antibiotics, you are no longer contagious.  Please discard your toothbrush as well as any other oral devices that you are currently using and replace them with new ones to avoid reinfection.    Please read below to learn more about the medications, dosages and frequencies that I recommend to help alleviate your symptoms and to get you feeling better soon:   Z-Pak (azithromycin):  Please take two (2) tablets on day one and one tablet daily thereafter until the prescription is complete.This antibiotic can cause upset stomach, this will resolve once antibiotics are complete.  You are welcome to take a probiotic, eat yogurt, take Imodium while taking this medication.  Please avoid other systemic medications such as Maalox,  Pepto-Bismol or milk of magnesia as they can interfere with the body's ability to absorb the antibiotics.       Flonase (fluticasone): This is a steroid nasal spray that used once daily, 1 spray in each nare.  This works best when used on a daily basis. This medication does not work well if it is only used when you think you need it.  After 3 to 5 days of use, you will notice significant reduction of the inflammation and mucus production that is currently being caused by exposure to allergens, whether seasonal or environmental.  The most common side effect of this medication is nosebleeds.  If you experience a nosebleed, please discontinue use for 1 week, then feel free to resume.  If you find that your insurance will not pay for this medication, please consider a different nasal steroids such as Nasonex (mometasone), or Nasacort (triamcinolone).    If symptoms have not meaningfully improved in the next 5 to 7 days, please return for repeat evaluation or follow-up with your regular provider.  If symptoms have worsened in the next 3 to 5 days, please return for repeat evaluation or follow-up with your regular provider.    Thank you for visiting urgent care today.  We appreciate the opportunity to participate in your care.

## 2023-05-07 NOTE — ED Triage Notes (Addendum)
Patient has a sore throat that started this morning. Has a child in family that has had strep and has a sore throat again

## 2023-05-07 NOTE — ED Provider Notes (Signed)
[facility]    CSN: [id number] Arrival date & time: 05/07/23  1746    HISTORY   Chief Complaint  Patient presents with   Sore Throat   HPI Selena Diaz is a pleasant, 37 y.o. female who presents to urgent care today. Mother complains of a sore throat that began this morning.  Mother states she is also been having a dry cough as well.  Mother states her son was seen and treated for streptococcal pharyngitis on Apr 16, 2023.  She herself was seen on Apr 16, 2023 for similar complaints of sore throat but rapid strep test was negative.  Patient was diagnosed with otitis media and treated with a 10-day course of cefdinir.  Patient was also provided with a prescription for Sudafed and Flonase to address nasal congestion.  The history is provided by the patient.   Past Medical History:  Diagnosis Date   Medical history non-contributory    Patient Active Problem List   Diagnosis Date Noted   SVD (spontaneous vaginal delivery) 07/29/2021   Postpartum care following vaginal delivery 8/24 07/29/2021   Encounter for induction of labor 07/28/2021   Ganglion cyst of dorsum of right wrist 11/01/2016   Vitamin D deficiency 10/17/2016   Past Surgical History:  Procedure Laterality Date   MOUTH SURGERY     OB History     Gravida  3   Para  3   Term  3   Preterm      AB      Living  3      SAB      IAB      Ectopic      Multiple  0   Live Births  3          Home Medications    Prior to Admission medications   Medication Sig Start Date End Date Taking? Authorizing Provider  benzocaine-Menthol (DERMOPLAST) 20-0.5 % AERO Apply 1 application topically as needed for irritation (perineal discomfort). 07/30/21   Neta Mends, CNM  coconut oil OIL Apply 1 application topically as needed. 07/30/21   Neta Mends, CNM  ibuprofen (ADVIL) 600 MG tablet Take 1 tablet (600 mg total) by mouth every 6 (six) hours. 07/30/21   Neta Mends, CNM  Prenatal Vit-Fe  Fumarate-FA (PRENATAL VITAMIN PO) Take by mouth.    [provider]    Family History Family History  Problem Relation Age of Onset   Heart disease Mother    Thyroid disease Mother    Social History Social History   Tobacco Use   Smoking status: Never   Smokeless tobacco: Never  Vaping Use   Vaping Use: Never used  Substance Use Topics   Alcohol use: No    Alcohol/week: 0.0 standard drinks of alcohol   Drug use: No   Allergies   Patient has no known allergies.  Review of Systems Review of Systems Pertinent findings revealed after performing a 14 point review of systems has been noted in the history of present illness.  Physical Exam Vital Signs BP 108/68 (BP Location: Right Arm)   Pulse 68   Temp 97.8 F (36.6 C) (Oral)   Resp 18   SpO2 98%   No data found.  Physical Exam Vitals and nursing note reviewed.  Constitutional:      General: She is awake. She is not in acute distress.    Appearance: Normal appearance. She is well-developed and well-groomed. She is not ill-appearing or toxic-appearing.  HENT:     Head: Normocephalic and atraumatic.     Salivary Glands: Right salivary gland is not diffusely enlarged or tender. Left salivary gland is not diffusely enlarged or tender.     Right Ear: Hearing, ear canal and external ear normal. Tympanic membrane is bulging (clear fluid).     Left Ear: Hearing, ear canal and external ear normal. Tympanic membrane is bulging (clear fluid).     Nose: No mucosal edema, congestion or rhinorrhea.     Right Turbinates: Swollen and pale. Not enlarged.     Left Turbinates: Swollen and pale. Not enlarged.     Right Sinus: No maxillary sinus tenderness or frontal sinus tenderness.     Left Sinus: No maxillary sinus tenderness or frontal sinus tenderness.     Mouth/Throat:     Lips: Pink. No lesions.     Mouth: Mucous membranes are moist. No oral lesions or angioedema.     Dentition: No gingival swelling.     Tongue: No  lesions.     Palate: No mass.     Pharynx: Uvula midline. Pharyngeal swelling, oropharyngeal exudate and posterior oropharyngeal erythema present. No uvula swelling.     Tonsils: Tonsillar exudate present. 1+ on the right. 1+ on the left.  Eyes:     Extraocular Movements: Extraocular movements intact.     Conjunctiva/sclera: Conjunctivae normal.     Pupils: Pupils are equal, round, and reactive to light.  Neck:     Thyroid: No thyroid mass, thyromegaly or thyroid tenderness.     Trachea: No tracheal tenderness, abnormal tracheal secretions or tracheal deviation.  Cardiovascular:     Rate and Rhythm: Normal rate and regular rhythm.     Pulses: Normal pulses.     Heart sounds: Normal heart sounds, S1 normal and S2 normal. No murmur heard.    No friction rub. No gallop.  Pulmonary:     Effort: Pulmonary effort is normal. No accessory muscle usage, prolonged expiration, respiratory distress or retractions.     Breath sounds: No stridor, decreased air movement or transmitted upper airway sounds. No decreased breath sounds, wheezing, rhonchi or rales.  Abdominal:     General: Bowel sounds are normal.     Palpations: Abdomen is soft.     Tenderness: There is generalized abdominal tenderness. There is no right CVA tenderness, left CVA tenderness or rebound. Negative signs include Murphy's sign.     Hernia: No hernia is present.  Musculoskeletal:        General: No tenderness. Normal range of motion.     Cervical back: Full passive range of motion without pain, normal range of motion and neck supple.     Right lower leg: No edema.     Left lower leg: No edema.  Lymphadenopathy:     Cervical: No cervical adenopathy.  Skin:    General: Skin is warm and dry.     Findings: No erythema, lesion or rash.  Neurological:     General: No focal deficit present.     Mental Status: She is alert and oriented to person, place, and time. Mental status is at baseline.  Psychiatric:        Mood and  Affect: Mood normal.        Behavior: Behavior normal. Behavior is cooperative.        Thought Content: Thought content normal.        Judgment: Judgment normal.     Visual Acuity Right Eye Distance:   Left  Eye Distance:   Bilateral Distance:    Right Eye Near:   Left Eye Near:    Bilateral Near:     UC Couse / Diagnostics / Procedures:     Radiology No results found.  Procedures Procedures (including critical care time) EKG  Pending results:  Labs Reviewed  CULTURE, GROUP A STREP [facility])  POCT RAPID STREP A (OFFICE)    Medications Ordered in UC: Medications - No data to display  UC Diagnoses / Final Clinical Impressions(s)   I have reviewed the triage vital signs and the nursing notes.  Pertinent labs & imaging results that were available during my care of the patient were reviewed by me and considered in my medical decision making (see chart for details).    Final diagnoses:  Rhinosinusitis  Acute pharyngitis, unspecified etiology  Exposure to Streptococcal pharyngitis   Rapid strep test today was negative.  Patient was advised that we will perform throat culture to confirm.  Based on physical exam findings as well as patient's complaint sore throat in addition to her son's recurrent strep pharyngitis today, will treat patient empirically for presumed bacterial tonsillitis with a 5-day course of azithromycin.  Patient further advised that physical exam findings are concerning for uncontrolled allergies which may be why she had an ear infection 3 weeks ago and has nasal congestion.  Patient advised to continue Flonase as was prescribed for her 3 months ago.  Prescription was renewed.  Conservative care recommended.  Return precautions advised.  Please see discharge instructions below for details of plan of care as provided to patient. ED Prescriptions     Medication Sig Dispense Auth. Provider   azithromycin (ZITHROMAX) 250 MG tablet Take 2 tablets (500 mg total) by  mouth daily for 1 day, THEN 1 tablet (250 mg total) daily for 4 days. 6 tablet Theadora Rama Scales, PA-C   fluticasone (FLONASE) 50 MCG/ACT nasal spray Place 1 spray into both nostrils daily. Begin by using 2 sprays in each nare daily for 3 to 5 days, then decrease to 1 spray in each nare daily. 15.8 mL Theadora Rama Scales, PA-C      PDMP not reviewed this encounter.  Pending results:  Labs Reviewed  CULTURE, GROUP A STREP [facility])  POCT RAPID STREP A (OFFICE)    Discharge Instructions:   Discharge Instructions      Your strep test today is negative.  Streptococcal throat culture will be performed per our protocol, please keep in mind that the rapid strep test that we perform here at urgent care only catches 40% of strep throat infections.   Based on my physical exam findings and the history you provided to me today, I recommend that you begin antibiotics now for presumed strep throat instead of waiting for the strep culture result.  I have sent a prescription to your pharmacy.  After 24 hours of antibiotics, you should begin to feel significantly better.     If your streptococcal throat culture has a negative result but you feel significantly better after taking antibiotics for 24 to 48 hours, I strongly recommend that you finish the full 10-day course.  Bacterial culture tests are only as reliable as the laboratory technician performing them.     Alternately, if your streptococcal throat culture has a negative result and you see no improvement of your symptoms after 24 to 48 hours of antibiotics, please discontinue the antibiotics as they are no longer indicated.  Your throat infection will then be most likely  considered viral and will have to resolve on its own.   After 24 hours of taking antibiotics, you are no longer contagious.  Please discard your toothbrush as well as any other oral devices that you are currently using and replace them with new ones to avoid reinfection.     Please read below to learn more about the medications, dosages and frequencies that I recommend to help alleviate your symptoms and to get you feeling better soon:   Z-Pak (azithromycin):  Please take two (2) tablets on day one and one tablet daily thereafter until the prescription is complete.This antibiotic can cause upset stomach, this will resolve once antibiotics are complete.  You are welcome to take a probiotic, eat yogurt, take Imodium while taking this medication.  Please avoid other systemic medications such as Maalox, Pepto-Bismol or milk of magnesia as they can interfere with the body's ability to absorb the antibiotics.       Flonase (fluticasone): This is a steroid nasal spray that used once daily, 1 spray in each nare.  This works best when used on a daily basis. This medication does not work well if it is only used when you think you need it.  After 3 to 5 days of use, you will notice significant reduction of the inflammation and mucus production that is currently being caused by exposure to allergens, whether seasonal or environmental.  The most common side effect of this medication is nosebleeds.  If you experience a nosebleed, please discontinue use for 1 week, then feel free to resume.  If you find that your insurance will not pay for this medication, please consider a different nasal steroids such as Nasonex (mometasone), or Nasacort (triamcinolone).    If symptoms have not meaningfully improved in the next 5 to 7 days, please return for repeat evaluation or follow-up with your regular provider.  If symptoms have worsened in the next 3 to 5 days, please return for repeat evaluation or follow-up with your regular provider.    Thank you for visiting urgent care today.  We appreciate the opportunity to participate in your care.       Disposition Upon Discharge:  Condition: stable for discharge home  Patient presented with an acute illness with associated systemic symptoms and  significant discomfort requiring urgent management. In my opinion, this is a condition that a prudent lay person (someone who possesses an average knowledge of health and medicine) may potentially expect to result in complications if not addressed urgently such as respiratory distress, impairment of bodily function or dysfunction of bodily organs.   Routine symptom specific, illness specific and/or disease specific instructions were discussed with the patient and/or caregiver at length.   As such, the patient has been evaluated and assessed, work-up was performed and treatment was provided in alignment with urgent care protocols and evidence based medicine.  Patient/parent/caregiver has been advised that the patient may require follow up for further testing and treatment if the symptoms continue in spite of treatment, as clinically indicated and appropriate.  Patient/parent/caregiver has been advised to return to the [facility] or PCP if no better; to PCP or the Emergency Department if new signs and symptoms develop, or if the current signs or symptoms continue to change or worsen for further workup, evaluation and treatment as clinically indicated and appropriate  The patient will follow up with their current PCP if and as advised. If the patient does not currently have a PCP we will assist them in obtaining one.  The patient may need specialty follow up if the symptoms continue, in spite of conservative treatment and management, for further workup, evaluation, consultation and treatment as clinically indicated and appropriate.  Patient/parent/caregiver verbalized understanding and agreement of plan as discussed.  All questions were addressed during visit.  Please see discharge instructions below for further details of plan.  This office note has been dictated using Teaching laboratory technician.  Unfortunately, this method of dictation can sometimes lead to typographical or grammatical errors.  I  apologize for your inconvenience in advance if this occurs.  Please do not hesitate to reach out to me if clarification is needed.      Theadora Rama Scales, PA-C 05/08/23 1030

## 2023-05-08 LAB — CULTURE, GROUP A STREP (THRC)

## 2023-05-09 LAB — CULTURE, GROUP A STREP (THRC)

## 2024-02-01 ENCOUNTER — Other Ambulatory Visit: Payer: Self-pay | Admitting: Obstetrics

## 2024-02-01 ENCOUNTER — Other Ambulatory Visit (HOSPITAL_COMMUNITY): Payer: Self-pay | Admitting: Obstetrics

## 2024-02-01 ENCOUNTER — Encounter (HOSPITAL_COMMUNITY): Payer: Self-pay | Admitting: Obstetrics

## 2024-02-01 DIAGNOSIS — O021 Missed abortion: Secondary | ICD-10-CM

## 2024-02-01 NOTE — Progress Notes (Signed)
 Spoke w/ via phone for pre-op interview--- pt Lab needs dos----  orders need second sign--- CBC/ BMP/ T&S       Lab results------  no COVID test -----patient states asymptomatic no test needed Arrive at ------- 1115 on 02-02-2024 NPO after MN NO Solid Food.  Clear liquids from MN until--- 1015 Pre-Surgery Ensure or G2: n/a  Med rec completed Medications to take morning of surgery ----- none Diabetic medication ----- n/a  GLP1 agonist last dose: n/a GLP1 instructions:  Patient instructed no nail polish to be worn day of surgery Patient instructed to bring photo id and insurance card day of surgery Patient aware to have Driver (ride ) / caregiver    for 24 hours after surgery - husband, fhed Patient Special Instructions ----- n/a Pre-Op special Instructions ----- case posted add-on today/  orders need second sign  Patient verbalized understanding of instructions that were given at this phone interview. Patient denies chest pain, sob, fever, cough at the interview.

## 2024-02-02 ENCOUNTER — Other Ambulatory Visit: Payer: Self-pay

## 2024-02-02 ENCOUNTER — Ambulatory Visit (HOSPITAL_COMMUNITY): Payer: PPO | Admitting: Anesthesiology

## 2024-02-02 ENCOUNTER — Ambulatory Visit (HOSPITAL_BASED_OUTPATIENT_CLINIC_OR_DEPARTMENT_OTHER): Payer: PPO | Admitting: Anesthesiology

## 2024-02-02 ENCOUNTER — Ambulatory Visit (HOSPITAL_COMMUNITY)
Admission: RE | Admit: 2024-02-02 | Discharge: 2024-02-02 | Disposition: A | Discharge status: Home / Self Care | Payer: PPO | Source: Ambulatory Visit | Attending: Obstetrics | Admitting: Obstetrics

## 2024-02-02 ENCOUNTER — Encounter (HOSPITAL_COMMUNITY): Payer: Self-pay | Admitting: Obstetrics

## 2024-02-02 ENCOUNTER — Encounter (HOSPITAL_COMMUNITY)
Admission: RE | Disposition: A | Discharge status: Home / Self Care | Payer: Self-pay | Source: Home / Self Care | Attending: Obstetrics

## 2024-02-02 ENCOUNTER — Ambulatory Visit (HOSPITAL_COMMUNITY)
Admission: RE | Admit: 2024-02-02 | Discharge: 2024-02-02 | Disposition: A | Discharge status: Home / Self Care | Payer: PPO | Attending: Obstetrics | Admitting: Obstetrics

## 2024-02-02 DIAGNOSIS — O021 Missed abortion: Secondary | ICD-10-CM | POA: Insufficient documentation

## 2024-02-02 DIAGNOSIS — Z3A16 16 weeks gestation of pregnancy: Secondary | ICD-10-CM | POA: Diagnosis not present

## 2024-02-02 DIAGNOSIS — Z3A00 Weeks of gestation of pregnancy not specified: Secondary | ICD-10-CM

## 2024-02-02 DIAGNOSIS — Z01818 Encounter for other preprocedural examination: Secondary | ICD-10-CM

## 2024-02-02 HISTORY — DX: Missed abortion: O02.1

## 2024-02-02 HISTORY — PX: DILATION AND EVACUATION: SHX1459

## 2024-02-02 HISTORY — PX: OPERATIVE ULTRASOUND: SHX5996

## 2024-02-02 HISTORY — DX: Migraine, unspecified, not intractable, without status migrainosus: G43.909

## 2024-02-02 LAB — TYPE AND SCREEN
ABO/RH(D): A POS
Antibody Screen: NEGATIVE

## 2024-02-02 LAB — BASIC METABOLIC PANEL
Anion gap: 13 (ref 5–15)
BUN: 6 mg/dL (ref 6–20)
CO2: 20 mmol/L — ABNORMAL LOW (ref 22–32)
Calcium: 9.2 mg/dL (ref 8.9–10.3)
Chloride: 105 mmol/L (ref 98–111)
Creatinine, Ser: 0.44 mg/dL (ref 0.44–1.00)
GFR, Estimated: >60 mL/min (ref >60)
Glucose, Bld: 81 mg/dL (ref 70–99)
Potassium: 3.2 mmol/L — ABNORMAL LOW (ref 3.5–5.1)
Sodium: 138 mmol/L (ref 135–145)

## 2024-02-02 LAB — CBC
HCT: 33.7 % — ABNORMAL LOW (ref 36.0–46.0)
Hemoglobin: 12.1 g/dL (ref 12.0–15.0)
MCH: 32.4 pg (ref 26.0–34.0)
MCHC: 35.9 g/dL (ref 30.0–36.0)
MCV: 90.1 fL (ref 80.0–100.0)
Platelets: 183 10*3/uL (ref 150–400)
RBC: 3.74 MIL/uL — ABNORMAL LOW (ref 3.87–5.11)
RDW: 13.2 % (ref 11.5–15.5)
WBC: 9.7 10*3/uL (ref 4.0–10.5)
nRBC: 0 % (ref 0.0–0.2)

## 2024-02-02 SURGERY — DILATION AND EVACUATION, UTERUS, SECOND TRIMESTER
Anesthesia: General | Site: Uterus

## 2024-02-02 MED ORDER — ACETAMINOPHEN 500 MG PO TABS
1000.0000 mg | ORAL_TABLET | Freq: Once | ORAL | Status: AC
Start: 2024-02-02 — End: 2024-02-02
  Administered 2024-02-02: 1000 mg via ORAL

## 2024-02-02 MED ORDER — FENTANYL CITRATE (PF) 100 MCG/2ML IJ SOLN: 25.0000 ug | INTRAMUSCULAR | Status: DC | PRN

## 2024-02-02 MED ORDER — SCOPOLAMINE 1 MG/3DAYS TD PT72
1.0000 | MEDICATED_PATCH | Freq: Once | TRANSDERMAL | Status: DC
  Administered 2024-02-02: 1.5 mg via TRANSDERMAL

## 2024-02-02 MED ORDER — CARBOPROST TROMETHAMINE 250 MCG/ML IM SOLN
INTRAMUSCULAR | Status: AC
  Filled 2024-02-02: qty 1

## 2024-02-02 MED ORDER — CHLOROPROCAINE HCL 1 % IJ SOLN
INTRAMUSCULAR | Status: DC | PRN
  Administered 2024-02-02: 20 mL

## 2024-02-02 MED ORDER — LACTATED RINGERS IV SOLN: INTRAVENOUS | Status: DC

## 2024-02-02 MED ORDER — DROPERIDOL 2.5 MG/ML IJ SOLN: 0.6250 mg | Freq: Once | INTRAMUSCULAR | Status: DC | PRN

## 2024-02-02 MED ORDER — OXYCODONE HCL 5 MG PO TABS: 5.0000 mg | ORAL_TABLET | Freq: Once | ORAL | Status: DC | PRN

## 2024-02-02 MED ORDER — ACETAMINOPHEN 500 MG PO TABS
1000.0000 mg | ORAL_TABLET | Freq: Four times a day (QID) | ORAL | Status: AC
Start: 2024-02-02 — End: 2024-02-09

## 2024-02-02 MED ORDER — PHENYLEPHRINE HCL-NACL 20-0.9 MG/250ML-% IV SOLN
INTRAVENOUS | Status: DC | PRN
  Administered 2024-02-02 (×5): 80 ug via INTRAVENOUS

## 2024-02-02 MED ORDER — MIDAZOLAM HCL 2 MG/2ML IJ SOLN
INTRAMUSCULAR | Status: DC | PRN
  Administered 2024-02-02: 2 mg via INTRAVENOUS

## 2024-02-02 MED ORDER — TRANEXAMIC ACID-NACL 1000-0.7 MG/100ML-% IV SOLN
INTRAVENOUS | Status: AC
  Filled 2024-02-02: qty 100

## 2024-02-02 MED ORDER — DEXAMETHASONE SODIUM PHOSPHATE 10 MG/ML IJ SOLN
INTRAMUSCULAR | Status: DC | PRN
  Administered 2024-02-02: 10 mg via INTRAVENOUS

## 2024-02-02 MED ORDER — LIDOCAINE 2% (20 MG/ML) 5 ML SYRINGE
INTRAMUSCULAR | Status: DC | PRN
  Administered 2024-02-02: 100 mg via INTRAVENOUS

## 2024-02-02 MED ORDER — CHLORHEXIDINE GLUCONATE 0.12 % MT SOLN
OROMUCOSAL | Status: AC
  Filled 2024-02-02: qty 15

## 2024-02-02 MED ORDER — SCOPOLAMINE 1 MG/3DAYS TD PT72
MEDICATED_PATCH | TRANSDERMAL | Status: AC
  Filled 2024-02-02: qty 1

## 2024-02-02 MED ORDER — ORAL CARE MOUTH RINSE: 15.0000 mL | Freq: Once | OROMUCOSAL | Status: AC

## 2024-02-02 MED ORDER — FENTANYL CITRATE (PF) 250 MCG/5ML IJ SOLN
INTRAMUSCULAR | Status: DC | PRN
  Administered 2024-02-02: 50 ug via INTRAVENOUS

## 2024-02-02 MED ORDER — DOXYCYCLINE HYCLATE 100 MG IV SOLR
200.0000 mg | INTRAVENOUS | Status: AC
  Administered 2024-02-02: 200 mg via INTRAVENOUS
  Filled 2024-02-02: qty 200

## 2024-02-02 MED ORDER — PROPOFOL 10 MG/ML IV BOLUS
INTRAVENOUS | Status: DC | PRN
  Administered 2024-02-02: 150 mg via INTRAVENOUS

## 2024-02-02 MED ORDER — ONDANSETRON HCL 4 MG/2ML IJ SOLN
INTRAMUSCULAR | Status: DC | PRN
  Administered 2024-02-02: 4 mg via INTRAVENOUS

## 2024-02-02 MED ORDER — POVIDONE-IODINE 10 % EX SWAB: 2.0000 | Freq: Once | CUTANEOUS | Status: DC

## 2024-02-02 MED ORDER — SODIUM CHLORIDE (PF) 0.9 % IJ SOLN
INTRAMUSCULAR | Status: AC
  Filled 2024-02-02: qty 100

## 2024-02-02 MED ORDER — KETOROLAC TROMETHAMINE 30 MG/ML IJ SOLN
INTRAMUSCULAR | Status: DC | PRN
  Administered 2024-02-02: 30 mg via INTRAVENOUS

## 2024-02-02 MED ORDER — MIDAZOLAM HCL 2 MG/2ML IJ SOLN
INTRAMUSCULAR | Status: AC
  Filled 2024-02-02: qty 2

## 2024-02-02 MED ORDER — CHLOROPROCAINE HCL 1 % IJ SOLN
INTRAMUSCULAR | Status: AC
  Filled 2024-02-02: qty 30

## 2024-02-02 MED ORDER — ACETAMINOPHEN 500 MG PO TABS
ORAL_TABLET | ORAL | Status: AC
  Filled 2024-02-02: qty 2

## 2024-02-02 MED ORDER — METHYLERGONOVINE MALEATE 0.2 MG/ML IJ SOLN
INTRAMUSCULAR | Status: AC
  Filled 2024-02-02: qty 3

## 2024-02-02 MED ORDER — FENTANYL CITRATE (PF) 250 MCG/5ML IJ SOLN
INTRAMUSCULAR | Status: AC
  Filled 2024-02-02: qty 5

## 2024-02-02 MED ORDER — IBUPROFEN 200 MG PO TABS
600.0000 mg | ORAL_TABLET | Freq: Four times a day (QID) | ORAL | Status: AC
Start: 2024-02-02 — End: 2024-02-09

## 2024-02-02 MED ORDER — CHLORHEXIDINE GLUCONATE 0.12 % MT SOLN
15.0000 mL | Freq: Once | OROMUCOSAL | Status: AC
  Administered 2024-02-02: 15 mL via OROMUCOSAL

## 2024-02-02 MED ORDER — MISOPROSTOL 200 MCG PO TABS
ORAL_TABLET | ORAL | Status: AC
  Filled 2024-02-02: qty 5

## 2024-02-02 MED ORDER — VASOPRESSIN 20 UNIT/ML IV SOLN
INTRAVENOUS | Status: DC | PRN
  Administered 2024-02-02: 20 mL

## 2024-02-02 MED ORDER — OXYCODONE HCL 5 MG/5ML PO SOLN: 5.0000 mg | Freq: Once | ORAL | Status: DC | PRN

## 2024-02-02 MED ORDER — VASOPRESSIN 20 UNIT/ML IV SOLN
INTRAVENOUS | Status: AC
  Filled 2024-02-02: qty 1

## 2024-02-02 SURGICAL SUPPLY — 24 items
CATH ROBINSON RED A/P 16FR (CATHETERS) ×2 IMPLANT
CNTNR URN SCR LID CUP LEK RST (MISCELLANEOUS) IMPLANT
FILTER UTR ASPR ASSEMBLY (MISCELLANEOUS) ×2 IMPLANT
GLOVE BIO SURGEON STRL SZ7 (GLOVE) ×2 IMPLANT
GLOVE BIOGEL PI IND STRL 7.0 (GLOVE) ×4 IMPLANT
GOWN STRL REUS W/ TWL LRG LVL3 (GOWN DISPOSABLE) ×4 IMPLANT
HOSE CONNECTING 18IN BERKELEY (TUBING) ×2 IMPLANT
KIT BERKELEY 1ST TRI 3/8 NO TR (MISCELLANEOUS) ×2 IMPLANT
KIT BERKELEY 1ST TRIMESTER 3/8 (MISCELLANEOUS) ×2 IMPLANT
KIT BERKELEY 2ND TRIMESTER 1/2 (COLLECTOR) IMPLANT
NDL HYPO 22X1.5 SAFETY MO (MISCELLANEOUS) IMPLANT
NEEDLE HYPO 22X1.5 SAFETY MO (MISCELLANEOUS) ×2 IMPLANT
NS IRRIG 1000ML POUR BTL (IV SOLUTION) ×2 IMPLANT
PACK VAGINAL MINOR WOMEN LF (CUSTOM PROCEDURE TRAY) ×2 IMPLANT
PAD OB MATERNITY 11 LF (PERSONAL CARE ITEMS) ×2 IMPLANT
SET BERKELEY SUCTION TUBING (SUCTIONS) ×2 IMPLANT
SPIKE FLUID TRANSFER (MISCELLANEOUS) ×2 IMPLANT
SYR 3ML LL SCALE MARK (SYRINGE) IMPLANT
TOWEL GREEN STERILE FF (TOWEL DISPOSABLE) ×4 IMPLANT
TUBE VACURETTE 2ND TRIMESTER (CANNULA) ×2 IMPLANT
UNDERPAD 30X36 HEAVY ABSORB (UNDERPADS AND DIAPERS) ×2 IMPLANT
VACURETTE 12 RIGID CVD (CANNULA) IMPLANT
VACURETTE 14MM CVD 1/2 BASE (CANNULA) IMPLANT
VACURETTE 16MM ASPIR CVD .5 (CANNULA) IMPLANT

## 2024-02-02 NOTE — Op Note (Signed)
 Operative Report  Surgeon: Edger House, MD  Assistant: None  Preoperative Diagnosis: Missed abortion @ 15w  Postoperative Diagnosis: Same  Anesthesia: General  Implants: None  Specimen: Products of conception  Drains: None  Fluids: Per anesthesia record  EBL: 100 mL   Complications: None  Findings: EUA: Normal external female genitalia. Normal appearing vagina and cervix. 2 laminaria and 2 gauze sponges removed from vagina. 1.5cm dilated at start of case. Intra-op: Ultrasound guidance used for procedure. Fetal tissue removed and all aspects identified- calvarium, thorax, abdomen, lower extremity x2, upper extremity x2 and placenta. Ultrasound at end of case showed thin endometrial echo. Minimal oozing from cervix at end of case. Uterus firm with bimanual massage. Fetal tissue sent for Anora chromosome testing.   Procedure in Detail:  The patient was seen in the preoperative holding area, consents were verified and all questions and concerns related to the proposed procedure were discussed in detail. The patient was transferred to the operating room where SCD's were placed. The patient underwent general anesthesia without complication. The patient was placed in dorsal lithotomy position and prepped and draped in usual sterile fashion. A time out was performed with all team members.    Laminaria and gauze sponges were removed from vagina and cervix found to be 1.5cm dilated. Paracervical injections of 20cc vasopressin (20u diluted in 100cc of normal saline - 4u total injected) and 20cc nesacaine 1% injected at 10 and 2 o'clock. Anterior cervical lip grasped with ring forceps and cervix serially dilated up to #53 Hagar. Under ultrasound guidance, bieres forceps used to grasp fetal tissue and remove with gentle traction. A 12mm canula was used to remove any remaining tissue with suction curette under ultrasound guidance. Three passes were made with minimal blood on third pass.  Ultrasound at end of case showed thin endometrium.  All instruments were removed. The cervix and uterus were massaged bimanually until a gauze in the vagina came out clean. Cervix was inspected and hemostasis ensured. All sponge and instrument counts correct x2.   Patient was awakened from anesthesia and taken to the recovery room in stable condition.   Marlene Bast, MD

## 2024-02-02 NOTE — Transfer of Care (Signed)
 Immediate Anesthesia Transfer of Care Note  Patient: Selena Diaz  Procedure(s) Performed: DILATATION AND EVACUATION (D&E) 2ND TRIMESTER WITH CHROMOSOME STUDIES (Uterus) OPERATIVE ULTRASOUND  Patient Location: PACU  Anesthesia Type:General  Level of Consciousness: awake, alert , oriented, and patient cooperative  Airway & Oxygen Therapy: Patient Spontanous Breathing and Patient connected to face mask oxygen  Post-op Assessment: Report given to RN, Post -op Vital signs reviewed and stable, Patient moving all extremities, Patient moving all extremities X 4, and Patient able to stick tongue midline  Post vital signs: Reviewed and stable  Last Vitals:  Vitals Value Taken Time  BP 110/54 02/02/24 1400  Temp    Pulse 75 02/02/24 1401  Resp 17 02/02/24 1401  SpO2 100 % 02/02/24 1401  Vitals shown include unfiled device data.  Last Pain:  Vitals:   02/02/24 1148  TempSrc: Oral  PainSc: 4       Patients Stated Pain Goal: 4 (02/02/24 1148)  Complications: No notable events documented.

## 2024-02-02 NOTE — Anesthesia Procedure Notes (Signed)
 Procedure Name: LMA Insertion Date/Time: 02/02/2024 1:32 PM  Performed by: Venia Carbon, CRNAPre-anesthesia Checklist: Patient identified, Emergency Drugs available, Suction available, Patient being monitored and Timeout performed Patient Re-evaluated:Patient Re-evaluated prior to induction Oxygen Delivery Method: Circle system utilized Preoxygenation: Pre-oxygenation with 100% oxygen Induction Type: IV induction Ventilation: Mask ventilation without difficulty LMA: LMA inserted LMA Size: 4.0 Number of attempts: 1 Airway Equipment and Method: Patient positioned with wedge pillow Placement Confirmation: positive ETCO2, CO2 detector and breath sounds checked- equal and bilateral Tube secured with: Tape

## 2024-02-02 NOTE — H&P (Signed)
 Surgical H&P  Diagnosis: Missed abortion @ 15w  S: 38yo [id number] at [redacted]w[redacted]d with history of migraines and newly diagnosed intrauterine fetal demise on ultrasound today. Fetus measuring [redacted]w[redacted]d on ultrasound with peri-abdominal and cranial edema. Patient without pain or vaginal bleeding. Now s/p laminaria placement in office yesterday afternoon, reports no significant cramping or vaginal bleeding.   O: Vitals:   02/02/24 1148  BP: 116/64  Pulse: 88  Resp: 16  Temp: 98.3 F (36.8 C)  SpO2: 100%   GA: well appearing, NAD Chest: normal work of breathing on room air Abd: soft, non-tender Psych: normal speech pattern and content  Labs: Lab Results  Component Value Date   WBC 9.7 02/02/2024   HGB 12.1 02/02/2024   HCT 33.7 (L) 02/02/2024   MCV 90.1 02/02/2024   PLT 183 02/02/2024   T&S: A+  U/S: 2/27: absent fetal heart rate with abdominal and cranial edema, measuring [redacted]w[redacted]d   A/P: 47WU [id number] at [redacted]w[redacted]d with newly diagnosed fetal demise on ultrasound presenting for D&E. Discussed with patient options for D&E versus induction including relative benefits and risks. Risks of surgery including infection, bleeding, and damage to uterus requiring laparoscopy or additional surgery reviewed and informed consent obtained. Patient opts for D&E. 2 size #3 laminaria and 2 gauze pads placed in cervix/vagina yesterday. Plan to proceed to OR when ready.  Marlene Bast, MD

## 2024-02-02 NOTE — Anesthesia Preprocedure Evaluation (Addendum)
 Anesthesia Evaluation  Patient identified by MRN, date of birth, ID band Patient awake    Reviewed: Allergy & Precautions, NPO status , Patient's Chart, lab work & pertinent test results  History of Anesthesia Complications Negative for: history of anesthetic complications  Airway Mallampati: II  TM Distance: >3 FB Neck ROM: Full    Dental no notable dental hx.    Pulmonary neg pulmonary ROS   Pulmonary exam normal        Cardiovascular negative cardio ROS Normal cardiovascular exam     Neuro/Psych  Headaches    GI/Hepatic negative GI ROS, Neg liver ROS,,,  Endo/Other  negative endocrine ROS    Renal/GU negative Renal ROS  negative genitourinary   Musculoskeletal negative musculoskeletal ROS (+)    Abdominal   Peds  Hematology negative hematology ROS (+)   Anesthesia Other Findings Day of surgery medications reviewed with patient.  Reproductive/Obstetrics 15w missed Ab                              Anesthesia Physical Anesthesia Plan  ASA: 2  Anesthesia Plan: General   Post-op Pain Management: Tylenol PO (pre-op)* and Toradol IV (intra-op)*   Induction: Intravenous  PONV Risk Score and Plan: 3 and Ondansetron, Dexamethasone, Treatment may vary due to age or medical condition, Midazolam and Scopolamine patch - Pre-op  Airway Management Planned: LMA  Additional Equipment: None  Intra-op Plan:   Post-operative Plan: Extubation in OR  Informed Consent: I have reviewed the patients History and Physical, chart, labs and discussed the procedure including the risks, benefits and alternatives for the proposed anesthesia with the patient or authorized representative who has indicated his/her understanding and acceptance.     Dental advisory given  Plan Discussed with: CRNA  Anesthesia Plan Comments:         Anesthesia Quick Evaluation

## 2024-02-02 NOTE — Anesthesia Postprocedure Evaluation (Signed)
 Anesthesia Post Note  Patient: Gustava Hallinan  Procedure(s) Performed: DILATATION AND EVACUATION (D&E) 2ND TRIMESTER WITH CHROMOSOME STUDIES (Uterus) OPERATIVE ULTRASOUND     Patient location during evaluation: PACU Anesthesia Type: General Level of consciousness: awake and alert Pain management: pain level controlled Vital Signs Assessment: post-procedure vital signs reviewed and stable Respiratory status: spontaneous breathing, nonlabored ventilation and respiratory function stable Cardiovascular status: blood pressure returned to baseline Postop Assessment: no apparent nausea or vomiting Anesthetic complications: no   No notable events documented.  Last Vitals:  Vitals:   02/02/24 1500 02/02/24 1512  BP:    Pulse: 70 61  Resp: 15 16  Temp:    SpO2: 100% 100%    Last Pain:  Vitals:   02/02/24 1512  TempSrc:   PainSc: 0-No pain                 Shanda Howells

## 2024-02-02 NOTE — Discharge Instructions (Addendum)
 Post-Operative Discharge Instructions  Surgery: Dilation & Evacuation  Surgeon: Marlene Bast, MD  Please follow the instructions below during your recovery period. If you have questions or concerns please call our office at [phone].  Please look out for the following and call the office immediately or go to nearest Emergency Room if you experience: - Chest pain - Shortness of breath - Fever >100.4 F  - Persistent nausea and/or vomiting - Difficulty urinating or not urinating for >6 hours  - Severe abdominal or pelvic pain not responsive to recommended pain medications - Heavy vaginal bleeding (soaking >2 pads per hour)  - Pain, swelling, and/or redness in one leg   Activity: You may resume normal activity as tolerated. We recommend walking as soon and as much as you are comfortably able.  Please do not put in anything in the vagina for 6 weeks after your surgery. This includes: - No intercourse - No tampons - No douching - No sitting in water (e.g. pools, baths, etc.)   Expectations: - Light vaginal bleeding is normal after your procedure and should resolve in the next few days. - Some abdominal soreness and bloating is normal. Please see below for recommended medications.  - Feeling more tired than usual for a few days following anesthesia is normal.   Diet: You may resume normal diet immediately after surgery. Anesthesia can make you feel nauseous, we recommend avoiding spicy or acidic foods for the first 72 hours after surgery. Drink lots of water (64oz/day).   Pain: We expect you to have some abdominal/pelvic pain and cramping for a few days after your surgery. We recommend taking medication around the clock for the first 48-72 hours after your surgery as follows: - Acetaminophen (Tylenol) 1000mg  every 6 hours  - Ibuprofen (Advil or Motrin) 600mg  every 6 hours - Miralax or other stool softener to keep bowel movements soft and avoid straining   We wish you a speedy  recovery!   Post Anesthesia Home Care Instructions  Activity: Get plenty of rest for the remainder of the day. A responsible adult should stay with you for 24 hours following the procedure.  For the next 24 hours, DO NOT: -Drive a car -Advertising copywriter -Drink alcoholic beverages -Take any medication unless instructed by your physician -Make any legal decisions or sign important papers.  Meals: Start with liquid foods such as gelatin or soup. Progress to regular foods as tolerated. Avoid greasy, spicy, heavy foods. If nausea and/or vomiting occur, drink only clear liquids until the nausea and/or vomiting subsides. Call your physician if vomiting continues.  Special Instructions/Symptoms: Your throat may feel dry or sore from the anesthesia or the breathing tube placed in your throat during surgery. If this causes discomfort, gargle with warm salt water. The discomfort should disappear within 24 hours.  If you had a scopolamine patch placed behind your ear for the management of post- operative nausea and/or vomiting:  1. The medication in the patch is effective for 72 hours, after which it should be removed.  Wrap patch in a tissue and discard in the trash. Wash hands thoroughly with soap and water. 2. You may remove the patch earlier than 72 hours if you experience unpleasant side effects which may include dry mouth, dizziness or visual disturbances. 3. Avoid touching the patch. Wash your hands with soap and water after contact with the patch.

## 2024-02-03 ENCOUNTER — Encounter (HOSPITAL_COMMUNITY): Payer: Self-pay | Admitting: Obstetrics

## 2024-02-05 LAB — SURGICAL PATHOLOGY

## 2024-02-08 LAB — ANORA MISCARRIAGE TEST - FRESH

## 2024-04-12 ENCOUNTER — Other Ambulatory Visit: Payer: Self-pay | Admitting: Obstetrics and Gynecology

## 2024-04-12 DIAGNOSIS — N644 Mastodynia: Secondary | ICD-10-CM

## 2024-04-19 ENCOUNTER — Ambulatory Visit
Admission: RE | Admit: 2024-04-19 | Discharge: 2024-04-19 | Disposition: A | Discharge status: Home / Self Care | Source: Ambulatory Visit | Attending: Obstetrics and Gynecology | Admitting: Obstetrics and Gynecology

## 2024-04-19 ENCOUNTER — Ambulatory Visit

## 2024-04-19 DIAGNOSIS — N644 Mastodynia: Secondary | ICD-10-CM

## 2024-08-06 ENCOUNTER — Ambulatory Visit (INDEPENDENT_AMBULATORY_CARE_PROVIDER_SITE_OTHER): Admitting: Audiology

## 2024-08-06 DIAGNOSIS — Z0110 Encounter for examination of ears and hearing without abnormal findings: Secondary | ICD-10-CM | POA: Diagnosis not present

## 2024-08-06 DIAGNOSIS — R42 Dizziness and giddiness: Secondary | ICD-10-CM

## 2024-08-06 NOTE — Progress Notes (Signed)
  [address], [address] [place], [place] [address] [phone]  Audiological Evaluation    Name: Selena Diaz     DOB:   07/12/1986      MRN:   [id number]                                                                                     Service Date: 08/06/2024     Accompanied by: unaccompanied   Patient comes today after Reyes Cohen, PA-C sent a referral for a hearing evaluation due to concerns with dizziness.   Symptoms Yes Details  Hearing loss  []    Tinnitus  []    Ear pain/ infections/pressure  [x]  Sometimes may feel like a  pain that feels like a needle   Balance problems  [x]  Lightheaded, swimmy headed sensation that may last more or less depending on level of activity that seems to be triggers by moving her head (left and right quickly, when leaning down or when pick up toys from the floor.  Noise exposure history  []    Previous ear surgeries  []    Family history of hearing loss  []    Amplification  []    Other  []      Otoscopy: Right ear: Clear external ear canal and notable landmarks visualized on the tympanic membrane. Left ear:  Clear external ear canal and notable landmarks visualized on the tympanic membrane.  Tympanometry: Right ear: Type A- Normal external ear canal volume with normal middle ear pressure and tympanic membrane compliance. Left ear: Type A- Normal external ear canal volume with normal middle ear pressure and tympanic membrane compliance.    Pure tone Audiometry: Both ears- Normal hearing from 250 Hz - 8000 Hz.  Speech Audiometry: Right ear- Speech Reception Threshold (SRT) was obtained at 10 dBHL. Left ear-Speech Reception Threshold (SRT) was obtained at 10 dBHL.   Word Recognition Score Tested using NU-6 (recorded) Right ear: 100% was obtained at a presentation level of 50 dBHL with contralateral masking which is deemed as  excellent. Left ear: 100% was obtained at a presentation level of 50 dBHL with contralateral masking which  is deemed as  excellent.   The hearing test results were completed under headphones and results are deemed to be of good reliability. Test technique:  conventional      Recommendations: Follow up with ENT as scheduled for today. Return for a hearing evaluation if concerns with hearing changes arise or per MD recommendation.   Magin Balbi MARIE LEROUX-MARTINEZ, AUD

## 2024-08-08 ENCOUNTER — Encounter (INDEPENDENT_AMBULATORY_CARE_PROVIDER_SITE_OTHER): Payer: Self-pay | Admitting: Physician Assistant

## 2024-08-08 ENCOUNTER — Ambulatory Visit (INDEPENDENT_AMBULATORY_CARE_PROVIDER_SITE_OTHER): Admitting: Physician Assistant

## 2024-08-08 VITALS — BP 115/74 | HR 70

## 2024-08-08 DIAGNOSIS — R42 Dizziness and giddiness: Secondary | ICD-10-CM

## 2024-08-08 NOTE — Progress Notes (Unsigned)
 Dear Dr. Lindsay, Here is my assessment for our mutual patient, Selena Diaz. Thank you for allowing me the opportunity to care for your patient. Please do not hesitate to contact me should you have any other questions. Sincerely, Chyrl Cohen PA-C  Otolaryngology Clinic Note Referring provider: Dr. Lindsay HPI:  Selena Diaz is a 38 y.o. female kindly referred by Dr. Lindsay   The patient is a 38 year old female seen in our office for evaluation of dizziness.  The patient notes that approximate 1 year ago she had her first episode of dizziness.  She notes room spinning sensation, feeling nauseous and sick.  She notes that this was triggered by movements and worsened by movements.  She notes that the symptoms improved with decreased activity.  She notes that she has had numerous episodes since that time.  She notes generally they are not triggered by movement such as picking up objects off the ground, she feels like if she stops doing the activity the symptoms resolve fairly quickly but if she has to finish the activity they can last for several hours.  She notes generally she gets back to normal, sometimes it does take longer to get back to her baseline.  She denies any pain, no ringing in the ear, no hearing loss.  She has a history of migraines.  She has been seen in urgent care for this and was given a prescription for Zofran  and another medication which she was told would dry out her ears; she reports this seemed to help her symptoms.  She notes it happens most days.  She denies any history of allergies, no significant trauma, no history of recurrent ear infections.    Independent Review of Additional Tests or Records:  Audiological evaluation 08/06/2024  Otoscopy: Right ear: Clear external ear canal and notable landmarks visualized on the tympanic membrane. Left ear:  Clear external ear canal and notable landmarks visualized on the tympanic membrane.   Tympanometry: Right ear: Type A- Normal  external ear canal volume with normal middle ear pressure and tympanic membrane compliance. Left ear: Type A- Normal external ear canal volume with normal middle ear pressure and tympanic membrane compliance.     Pure tone Audiometry: Both ears- Normal hearing from 250 Hz - 8000 Hz.   Speech Audiometry: Right ear- Speech Reception Threshold (SRT) was obtained at 10 dBHL. Left ear-Speech Reception Threshold (SRT) was obtained at 10 dBHL.   Word Recognition Score Tested using NU-6 (recorded) Right ear: 100% was obtained at a presentation level of 50 dBHL with contralateral masking which is deemed as  excellent. Left ear: 100% was obtained at a presentation level of 50 dBHL with contralateral masking which is deemed as  excellent.   The hearing test results were completed under headphones and results are deemed to be of good reliability. Test technique:  conventional       Recommendations: Follow up with ENT as scheduled for today. Return for a hearing evaluation if concerns with hearing changes arise or per MD recommendation.   PMH/Meds/All/SocHx/FamHx/ROS:   Past Medical History:  Diagnosis Date   Migraines    Missed abortion with fetal demise before 20 completed weeks of gestation    [redacted] weeks gestation     Past Surgical History:  Procedure Laterality Date   DILATION AND EVACUATION N/A 02/02/2024   Procedure: DILATATION AND EVACUATION (D&E) 2ND TRIMESTER WITH CHROMOSOME STUDIES;  Surgeon: D'Iorio, Hadassah LABOR, MD;  Location: [facility] OR;  Service: Gynecology;  Laterality: N/A;  Requests  1hr./[facility]   NO PAST SURGERIES     OPERATIVE ULTRASOUND N/A 02/02/2024   Procedure: OPERATIVE ULTRASOUND;  Surgeon: D'Iorio, Hadassah LABOR, MD;  Location: [facility] OR;  Service: Gynecology;  Laterality: N/A;    Family History  Problem Relation Age of Onset   Heart disease Mother    Thyroid disease Mother      Social Connections: Not on file      Current Outpatient Medications:     butalbital -acetaminophen -caffeine  (FIORICET) 50-325-40 MG tablet, Take 1 tablet by mouth every 4 (four) hours as needed for headache. (Patient not taking: Reported on 08/08/2024), Disp: , Rfl:    Calcium Carbonate (CALCIUM 600 PO), Take 600 mg by mouth daily. (Patient not taking: Reported on 08/08/2024), Disp: , Rfl:    CHOLINE PO, Take 900 mg by mouth daily. 450 mg (Patient not taking: Reported on 08/08/2024), Disp: , Rfl:    Prenatal Multivit-Min-Fe-FA (PRE-NATAL FORMULA PO), Take 1 tablet by mouth daily. (Patient not taking: Reported on 08/08/2024), Disp: , Rfl:    Physical Exam:   BP 115/74 (BP Location: Right Arm, Patient Position: Sitting, Cuff Size: Normal)   Pulse 70   SpO2 97%   Pertinent Findings  CN II-XII intact- no nystagmus  Bilateral EAC clear and TM intact with well pneumatized middle ear spaces Anterior rhinoscopy: Septum midline; bilateral inferior turbinates with no hypertrophy  No lesions of oral cavity/oropharynx; dentition WNL No obviously palpable neck masses/lymphadenopathy/thyromegaly No respiratory distress or stridor  Seprately Identifiable Procedures:  None  Impression & Plans:  Selena Diaz is a 38 y.o. female with the following   Dizziness-  38 year old female seen in our office for evaluation of dizziness.  The patient symptoms are most consistent with BPPV, she has no signs or symptoms that would be consistent of Mnire's disease, labyrinthitis/vestibular neuritis.  Although she does have a history of migraines this seems to be triggered by movement and improved with rest.  She has no alarming features on evaluation today.  I discussed treatment options including formal vestibular evaluation plus or minus vestibular rehab at the same time.  The patient would like to start vestibular rehab as well as have vestibular evaluation.  She will be referred to [facility] further vestibular evaluation, she will be referred to [facility] neurorehab for vestibular rehab.  I  would like to see her back in the office after completion of vestibular evaluation as well as rehab to see if she is having any improvement in her symptoms.  She was given strict return precautions, she verbalized understanding and agreement to today's plan had no further questions or concerns.   - f/u follow-up after vestibular evaluation    [phone] [facility])   Thank you for allowing me the opportunity to care for your patient. Please do not hesitate to contact me should you have any other questions.  Sincerely, Chyrl Cohen PA-C [facility] Phone: [phone] Fax: [phone]  08/08/2024, 10:44 AM

## 2024-08-09 ENCOUNTER — Encounter: Payer: Self-pay | Admitting: Audiology

## 2024-08-12 ENCOUNTER — Encounter (INDEPENDENT_AMBULATORY_CARE_PROVIDER_SITE_OTHER): Payer: Self-pay

## 2024-08-20 NOTE — Therapy (Signed)
 OUTPATIENT PHYSICAL THERAPY VESTIBULAR EVALUATION     Patient Name: Selena Diaz MRN: [id number] DOB:04/19/1986, 38 y.o., female Today's Date: 08/22/2024  END OF SESSION:  PT End of Session - 08/21/24 1453     Visit Number 1    Number of Visits 5    Date for PT Re-Evaluation 09/20/24    Authorization Type [employer]    PT Start Time 1452   pt late to eval   PT Stop Time 1527    PT Time Calculation (min) 35 min    Activity Tolerance Patient tolerated treatment well    Behavior During Therapy WFL for tasks assessed/performed          Past Medical History:  Diagnosis Date   Migraines    Missed abortion with fetal demise before 20 completed weeks of gestation    [redacted] weeks gestation   Past Surgical History:  Procedure Laterality Date   DILATION AND EVACUATION N/A 02/02/2024   Procedure: DILATATION AND EVACUATION (D&E) 2ND TRIMESTER WITH CHROMOSOME STUDIES;  Surgeon: D'Iorio, Hadassah LABOR, MD;  Location: [facility] OR;  Service: Gynecology;  Laterality: N/A;  Requests 1hr./[facility]   NO PAST SURGERIES     OPERATIVE ULTRASOUND N/A 02/02/2024   Procedure: OPERATIVE ULTRASOUND;  Surgeon: D'Iorio, Hadassah LABOR, MD;  Location: [facility] OR;  Service: Gynecology;  Laterality: N/A;   Patient Active Problem List   Diagnosis Date Noted   SVD (spontaneous vaginal delivery) 07/29/2021   Postpartum care following vaginal delivery 8/24 07/29/2021   Encounter for induction of labor 07/28/2021   Ganglion cyst of dorsum of right wrist 11/01/2016   Vitamin D  deficiency 10/17/2016    PCP: Gerlean Alan DASEN, NP  REFERRING PROVIDER: Palmer Purchase, PA-C  REFERRING DIAG: R42 (ICD-10-CM) - Vertigo  THERAPY DIAG:  Dizziness and giddiness  ONSET DATE: 08/09/2024  Rationale for Evaluation and Treatment: Rehabilitation  SUBJECTIVE:   SUBJECTIVE STATEMENT: Has dizziness when picking up things from the floor or moving her head from L to R.. Sometimes will have nausea. Sometimes will have to leave what she is  doing and go lay down. When she ignores it it becomes even worse and gets sweaty and nauseous. Reports that it started about a year ago. Sometimes when just sitting still will feel a weird feeling. Would feel like she could throw up if she keeps going. Reports migraines are controlled - takes Ubrelvy   Pt accompanied by: self  PERTINENT HISTORY: PMH: hx of migraines    Per ENT note: The patient notes that approximate 1 year ago she had her first episode of dizziness. She notes room spinning sensation, feeling nauseous and sick. She notes that this was triggered by movements and worsened by movements. She notes that the symptoms improved with decreased activity. She notes that she has had numerous episodes since that time.    PAIN:  Are you having pain? No not really pain, but not normal   Vitals:   08/21/24 1504  BP: 118/65  Pulse: 66     PRECAUTIONS: None  FALLS: Has patient fallen in last 6 months? 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  DIAGNOSTIC FINDINGS: No pertinent imaging   COGNITION: Overall cognitive status: Within functional limits for tasks assessed  POSTURE:  No Significant postural limitations  Cervical ROM:   WFL    GAIT:  Gait pattern: WFL Distance walked: Clinic distances  Assistive device utilized: None Level of assistance: Complete Independence Comments: No unsteadiness noted during eval    VESTIBULAR ASSESSMENT:  GENERAL OBSERVATION: Ambulates in with no AD.    SYMPTOM BEHAVIOR:  Subjective history: See above.   Non-Vestibular symptoms: headaches, nausea/vomiting, and migraine symptoms  Type of dizziness: Imbalance (Disequilibrium) and the feeling of being pregnant in the first trimester   Frequency: Everyday, some days and better than  other days   Duration: Reports sometimes the episodes can last all day, might not be the same level, but still there   Aggravating factors: Induced by motion: bending down to the ground and turning head quickly  Relieving factors: stopping what she is doing and going to lay down   Progression of symptoms: worse  OCULOMOTOR EXAM:  Ocular Alignment: normal  Ocular ROM: No Limitations  Spontaneous Nystagmus: absent  Gaze-Induced Nystagmus: absent  Smooth Pursuits: intact  Saccades: intact   VESTIBULAR - OCULAR REFLEX:   Slow VOR: Normal  VOR Cancellation: Normal, very slight dizziness   Head-Impulse Test: HIT Right: negative HIT Left: negative  Dynamic Visual Acuity: Static: Line 10 Dynamic: Line 8 2 line difference, mild symptoms    POSITIONAL TESTING: Right Dix-Hallpike: no nystagmus and pt reporting dizziness in position and when sitting up  Left Dix-Hallpike: no nystagmus and pt reporting head feeling heavy  Right Roll Test: no nystagmus Left Roll Test: no nystagmus Right Sidelying: no nystagmus and no dizziness in position, mild dizziness coming up  Left Sidelying: no nystagmus and no dizziness in position, some dizziness but not as bad as R side   MOTION SENSITIVITY:  Motion Sensitivity Quotient Intensity: 0 = none, 1 = Lightheaded, 2 = Mild, 3 = Moderate, 4 = Severe, 5 = Vomiting  Intensity  1. Sitting to supine 3  2. Supine to L side 0  3. Supine to R side 0  4. Supine to sitting 2  5. L Hallpike-Dix 2  6. Up from L  3  7. R Hallpike-Dix 2  8. Up from R  3  9. Sitting, head tipped to L knee 1  10. Head up from L knee 1  11. Sitting, head tipped to R knee 1  12. Head up from R knee 1  13. Sitting head turns x5 2  14.Sitting head nods x5 2  15. In stance, 180 turn to L  0  16. In stance, 180 turn to R 0                                                                                                                             TREATMENT DATE:  08/21/24   Self-Care: Clinical findings, POC, role of vestibular therapy, PT unsure of cause of pt's dizziness, but will work on habituation exercises and purpose of these   PATIENT EDUCATION: Education details: See above  Person educated: Patient Education method: Explanation Education comprehension: verbalized understanding  HOME EXERCISE PROGRAM: Will provide at future session   GOALS: Goals reviewed with patient? Yes  SHORT TERM GOALS: ALL STGS = LTGS   LONG TERM GOALS: Target date: 09/18/2024  DHI to be assessed with goal written.  Baseline:  Goal status: INITIAL  2.  Pt will report all items on MSQ as a 0-1/5 in order to demo improved motion sensitivity.  Baseline: see chart on 08/21/24 Goal status: INITIAL  3.  Pt will perform DVA with a 2 line difference or less with no dizziness in order to demo improved VOR.  Baseline: 2 line difference, mild symptoms  Goal status: INITIAL  4.  Pt will be able to perform standing and bending with minimal to no dizziness in order to demo improved tolerance for taking care of her kids  Baseline:  Goal status: INITIAL    ASSESSMENT:  CLINICAL IMPRESSION: Patient is a 38 year old female referred to Neuro OPPT for vertigo.   Pt's PMH is significant for: hx of migraines. The following deficits were present during the exam: motion sensitivity based on MSQ. Pt negative for BPPV and had normal oculomotor and VOR examination. Pt did have a 2 line difference on DVA which is WNL, but pt did report some mild dizziness.  Pt would benefit from skilled PT to address these impairments and functional limitations to decr dizziness.    OBJECTIVE IMPAIRMENTS: dizziness.   ACTIVITY LIMITATIONS: bending, transfers, bed mobility, locomotion level, and caring for others  PARTICIPATION LIMITATIONS: cleaning and laundry  PERSONAL FACTORS: Past/current experiences, Time since onset of injury/illness/exacerbation, and 1 comorbidity: hx of migraines   are also affecting patient's functional outcome.   REHAB POTENTIAL: Good  CLINICAL DECISION MAKING: Stable/uncomplicated  EVALUATION COMPLEXITY: Low   PLAN:  PT FREQUENCY: 1x/week  PT DURATION: 4 weeks  PLANNED INTERVENTIONS: 97164- PT Re-evaluation, 97110-Therapeutic exercises, 97530- Therapeutic activity, 97112- Neuromuscular re-education, 97535- Self Care, [id number]- Manual therapy, [phone]- Canalith repositioning, Patient/Family education, and Balance training  PLAN FOR NEXT SESSION: have pt fill out DHI and write goal, initiate HEP for VOR, brandt daroff, habituation to head motions and bending    Clarice Zulauf N Verlyn Lambert, PT,DPT 08/22/2024, 8:26 AM

## 2024-08-21 ENCOUNTER — Ambulatory Visit: Attending: Physician Assistant | Admitting: Physical Therapy

## 2024-08-21 ENCOUNTER — Encounter: Payer: Self-pay | Admitting: Physical Therapy

## 2024-08-21 VITALS — BP 118/65 | HR 66

## 2024-08-21 DIAGNOSIS — R42 Dizziness and giddiness: Secondary | ICD-10-CM | POA: Diagnosis present

## 2024-09-05 ENCOUNTER — Ambulatory Visit: Attending: Physician Assistant | Admitting: Physical Therapy

## 2024-09-05 ENCOUNTER — Encounter: Payer: Self-pay | Admitting: Physical Therapy

## 2024-09-05 DIAGNOSIS — R42 Dizziness and giddiness: Secondary | ICD-10-CM | POA: Insufficient documentation

## 2024-09-05 NOTE — Therapy (Signed)
 OUTPATIENT PHYSICAL THERAPY VESTIBULAR TREATMENT     Patient Name: Selena Diaz MRN: [id number] DOB:1986-02-12, 38 y.o., female Today's Date: 09/05/2024  END OF SESSION:  PT End of Session - 09/05/24 1107     Visit Number 2    Number of Visits 5    Date for Recertification  09/20/24    Authorization Type [employer]    PT Start Time 1105    PT Stop Time 1144    PT Time Calculation (min) 39 min    Activity Tolerance Patient tolerated treatment well    Behavior During Therapy WFL for tasks assessed/performed          Past Medical History:  Diagnosis Date   Migraines    Missed abortion with fetal demise before 20 completed weeks of gestation    [redacted] weeks gestation   Past Surgical History:  Procedure Laterality Date   DILATION AND EVACUATION N/A 02/02/2024   Procedure: DILATATION AND EVACUATION (D&E) 2ND TRIMESTER WITH CHROMOSOME STUDIES;  Surgeon: Selena Diaz LABOR, MD;  Location: [facility] OR;  Service: Gynecology;  Laterality: N/A;  Requests 1hr./[facility]   NO PAST SURGERIES     OPERATIVE ULTRASOUND N/A 02/02/2024   Procedure: OPERATIVE ULTRASOUND;  Surgeon: Selena Diaz LABOR, MD;  Location: [facility] OR;  Service: Gynecology;  Laterality: N/A;   Patient Active Problem List   Diagnosis Date Noted   SVD (spontaneous vaginal delivery) 07/29/2021   Postpartum care following vaginal delivery 8/24 07/29/2021   Encounter for induction of labor 07/28/2021   Ganglion cyst of dorsum of right wrist 11/01/2016   Vitamin D  deficiency 10/17/2016    PCP: Selena Alan DASEN, NP  REFERRING PROVIDER: Palmer Purchase, PA-C  REFERRING DIAG: R42 (ICD-10-CM) - Vertigo  THERAPY DIAG:  Dizziness and giddiness  ONSET DATE: 08/09/2024  Rationale for Evaluation and Treatment: Rehabilitation  SUBJECTIVE:   SUBJECTIVE STATEMENT: Feels like a heartbeat/bounding in her head. Will sometimes happen 3x a day. Has not yet told her PCP about this. Dizziness is unchanged.     Pt accompanied by:  self  PERTINENT HISTORY: PMH: hx of migraines    Per ENT note: The patient notes that approximate 1 year ago she had her first episode of dizziness. She notes room spinning sensation, feeling nauseous and sick. She notes that this was triggered by movements and worsened by movements. She notes that the symptoms improved with decreased activity. She notes that she has had numerous episodes since that time.    PAIN:  Are you having pain? No not really pain, but not normal   There were no vitals filed for this visit.    PRECAUTIONS: None  FALLS: Has patient fallen in last 6 months? 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  DIAGNOSTIC FINDINGS: No pertinent imaging   COGNITION: Overall cognitive status: Within functional limits for tasks assessed  POSTURE:  No Significant postural limitations  Cervical ROM:   WFL    GAIT: Gait pattern: WFL Distance walked: Clinic distances  Assistive device utilized: None Level of assistance: Complete Independence Comments: No unsteadiness noted during eval    VESTIBULAR ASSESSMENT:  GENERAL OBSERVATION: Ambulates in with no AD.    SYMPTOM BEHAVIOR:  Subjective history: See above.   Non-Vestibular symptoms: headaches, nausea/vomiting, and migraine symptoms  Type of dizziness: Imbalance (Disequilibrium) and the feeling of being  pregnant in the first trimester   Frequency: Everyday, some days and better than other days   Duration: Reports sometimes the episodes can last all day, might not be the same level, but still there   Aggravating factors: Induced by motion: bending down to the ground and turning head quickly  Relieving factors: stopping what she is doing and going to lay down   Progression of symptoms: worse  OCULOMOTOR  EXAM:  Ocular Alignment: normal  Ocular ROM: No Limitations  Spontaneous Nystagmus: absent  Gaze-Induced Nystagmus: absent  Smooth Pursuits: intact  Saccades: intact   VESTIBULAR - OCULAR REFLEX:   Slow VOR: Normal  VOR Cancellation: Normal, very slight dizziness   Head-Impulse Test: HIT Right: negative HIT Left: negative  Dynamic Visual Acuity: Static: Line 10 Dynamic: Line 8 2 line difference, mild symptoms    POSITIONAL TESTING: Right Dix-Hallpike: no nystagmus and pt reporting dizziness in position and when sitting up  Left Dix-Hallpike: no nystagmus and pt reporting head feeling heavy  Right Roll Test: no nystagmus Left Roll Test: no nystagmus Right Sidelying: no nystagmus and no dizziness in position, mild dizziness coming up  Left Sidelying: no nystagmus and no dizziness in position, some dizziness but not as bad as R side   MOTION SENSITIVITY:  Motion Sensitivity Quotient Intensity: 0 = none, 1 = Lightheaded, 2 = Mild, 3 = Moderate, 4 = Severe, 5 = Vomiting  Intensity  1. Sitting to supine 3  2. Supine to L side 0  3. Supine to R side 0  4. Supine to sitting 2  5. L Hallpike-Dix 2  6. Up from L  3  7. R Hallpike-Dix 2  8. Up from R  3  9. Sitting, head tipped to L knee 1  10. Head up from L knee 1  11. Sitting, head tipped to R knee 1  12. Head up from R knee 1  13. Sitting head turns x5 2  14.Sitting head nods x5 2  15. In stance, 180 turn to L  0  16. In stance, 180 turn to R 0                                                                                                                             TREATMENT DATE: 09/05/24   Therapeutic Activity: DHI: 34/100    Role of vestibular therapy, PT unsure of cause of pt's dizziness, but will work on habituation exercises and purpose of these and that if pt doesn't improve with exercises then will need to refer back to PCP, discussed importance of performing consistently at home to see if exercises will make a  difference   NMR:  Habituation: [name]: 5 reps each side, pt with moderate dizziness in each position   Gaze Adaptation: x1 Viewing Horizontal: Position: Standing, Time: 30 seconds, Reps: 3, and Comment: feels a Diaz dizziness, initial cues to slow down to make it through 30 seconds, performed another  rep in sitting with pt still having dizziness in sitting, so will start in seated position before progressing to standing  x1 Viewing Vertical:  Position: Sitting, Time: 30 seconds, Reps: 2, and Comment: some dizziness, but felt better than left and right   On air ex: Wide BOS: 2 x 5 reps head turns EO (moderate dizziness), 2 x 5 reps head nods (slight dizziness)    PATIENT EDUCATION: Education details: See therapeutic activity section, initial HEP  Person educated: Patient Education method: Explanation, Demonstration, Verbal cues, and Handouts Education comprehension: verbalized understanding and returned demonstration  HOME EXERCISE PROGRAM: Access Code: [email or web address] URL: [email or web address] Date: 09/05/2024 Prepared by: Sheffield Senate  Exercises - Brandt-Daroff Vestibular Exercise  - 1 x daily - 5 x weekly - 2 sets - 4-5 reps - Wide Stance with Head Rotation on Foam Pad  - 1-2 x daily - 5 x weekly - 2 sets - 5 reps and head nods   GOALS: Goals reviewed with patient? Yes  SHORT TERM GOALS: ALL STGS = LTGS   LONG TERM GOALS: Target date: 09/18/2024  Pt will decr DHI to 22/100 or less in order to demo decr handicap in regards to dizziness.  Baseline: 34/100  Goal status: INITIAL  2.  Pt will report all items on MSQ as a 0-1/5 in order to demo improved motion sensitivity.  Baseline: see chart on 08/21/24 Goal status: INITIAL  3.  Pt will perform DVA with a 2 line difference or less with no dizziness in order to demo improved VOR.  Baseline: 2 line difference, mild symptoms  Goal status: INITIAL  4.  Pt will be able to perform standing and bending  with minimal to no dizziness in order to demo improved tolerance for taking care of her kids  Baseline:  Goal status: INITIAL    ASSESSMENT:  CLINICAL IMPRESSION: Pt filled out the [facility] scoring a 34/100 indicating a mild handicap in regards to dizziness. LTG updated as appropriate. Remainder of session focused on initiating HEP for habituation tasks and VOR. Pt more symptomatic with VOR x1 and standing balance with head turns compared to head nods. Pt reporting mild/mod dizziness with exercises during session. Will continue per POC.    OBJECTIVE IMPAIRMENTS: dizziness.   ACTIVITY LIMITATIONS: bending, transfers, bed mobility, locomotion level, and caring for others  PARTICIPATION LIMITATIONS: cleaning and laundry  PERSONAL FACTORS: Past/current experiences, Time since onset of injury/illness/exacerbation, and 1 comorbidity: hx of migraines  are also affecting patient's functional outcome.   REHAB POTENTIAL: Good  CLINICAL DECISION MAKING: Stable/uncomplicated  EVALUATION COMPLEXITY: Low   PLAN:  PT FREQUENCY: 1x/week  PT DURATION: 4 weeks  PLANNED INTERVENTIONS: 97164- PT Re-evaluation, 97110-Therapeutic exercises, 97530- Therapeutic activity, 97112- Neuromuscular re-education, 97535- Self Care, [id number]- Manual therapy, [id number]- Canalith repositioning, Patient/Family education, and Balance training  PLAN FOR NEXT SESSION:  progress VOR, brandt daroff, habituation to head motions and bending, work on head motions    [employer], PT,DPT 09/05/2024, 11:45 AM

## 2024-09-11 ENCOUNTER — Encounter: Admitting: Physical Therapy

## 2024-09-18 ENCOUNTER — Ambulatory Visit: Admitting: Physical Therapy

## 2024-09-25 ENCOUNTER — Ambulatory Visit: Admitting: Physical Therapy

## 2024-09-25 ENCOUNTER — Telehealth: Payer: Self-pay | Admitting: Physical Therapy

## 2024-09-25 NOTE — Telephone Encounter (Signed)
 Attempted to call pt regarding no show appt, but pt did not have voicemail set up, so unable to leave message.  Sheffield Senate, PT, DPT 09/25/24 3:12 PM   [facility] [address] [place], [place]  [address] Phone:  [phone] Fax:  [phone]
